# Patient Record
Sex: Male | Born: 1950
Health system: Southern US, Community
[De-identification: ages and names within clinical notes are randomized; demographics above are authoritative.]

## PROBLEM LIST (undated history)

## (undated) DIAGNOSIS — E785 Hyperlipidemia, unspecified: Secondary | ICD-10-CM

## (undated) DIAGNOSIS — I10 Essential (primary) hypertension: Secondary | ICD-10-CM

## (undated) DIAGNOSIS — T7840XA Allergy, unspecified, initial encounter: Secondary | ICD-10-CM

## (undated) DIAGNOSIS — L409 Psoriasis, unspecified: Secondary | ICD-10-CM

## (undated) DIAGNOSIS — M109 Gout, unspecified: Secondary | ICD-10-CM

## (undated) DIAGNOSIS — R748 Abnormal levels of other serum enzymes: Secondary | ICD-10-CM

## (undated) DIAGNOSIS — M5430 Sciatica, unspecified side: Secondary | ICD-10-CM

## (undated) DIAGNOSIS — F1010 Alcohol abuse, uncomplicated: Secondary | ICD-10-CM

## (undated) DIAGNOSIS — R569 Unspecified convulsions: Secondary | ICD-10-CM

## (undated) HISTORY — DX: Unspecified convulsions: R56.9

## (undated) HISTORY — DX: Psoriasis, unspecified: L40.9

## (undated) HISTORY — PX: ROTATOR CUFF REPAIR: SHX139

## (undated) HISTORY — DX: Abnormal levels of other serum enzymes: R74.8

## (undated) HISTORY — DX: Gilbert syndrome: E80.4

## (undated) HISTORY — DX: Hyperlipidemia, unspecified: E78.5

## (undated) HISTORY — DX: Essential (primary) hypertension: I10

## (undated) HISTORY — PX: VASECTOMY: SHX75

## (undated) HISTORY — PX: FOOT SURGERY: SHX648

## (undated) HISTORY — DX: Allergy, unspecified, initial encounter: T78.40XA

## (undated) HISTORY — DX: Alcohol abuse, uncomplicated: F10.10

## (undated) HISTORY — DX: Sciatica, unspecified side: M54.30

## (undated) HISTORY — DX: Gout, unspecified: M10.9

---

## 2005-12-13 HISTORY — PX: LUMBAR DISC SURGERY: SHX700

## 2005-12-13 HISTORY — PX: CHOLECYSTECTOMY: SHX55

## 2007-09-05 ENCOUNTER — Ambulatory Visit: Payer: Self-pay | Admitting: Orthopedic Surgery

## 2007-09-19 ENCOUNTER — Other Ambulatory Visit: Payer: Self-pay

## 2007-09-19 ENCOUNTER — Ambulatory Visit: Payer: Self-pay | Admitting: Orthopedic Surgery

## 2007-09-27 ENCOUNTER — Ambulatory Visit: Payer: Self-pay | Admitting: Orthopedic Surgery

## 2010-01-22 ENCOUNTER — Emergency Department: Payer: Self-pay

## 2010-07-21 ENCOUNTER — Ambulatory Visit: Payer: Self-pay | Admitting: Family Medicine

## 2010-08-10 ENCOUNTER — Observation Stay: Payer: Self-pay | Admitting: Internal Medicine

## 2010-12-03 ENCOUNTER — Ambulatory Visit: Payer: Self-pay | Admitting: Urology

## 2012-10-14 ENCOUNTER — Emergency Department: Payer: Self-pay | Admitting: Emergency Medicine

## 2012-11-13 ENCOUNTER — Ambulatory Visit: Payer: Self-pay | Admitting: Family Medicine

## 2012-11-20 ENCOUNTER — Ambulatory Visit: Payer: Self-pay | Admitting: Family Medicine

## 2014-11-26 DIAGNOSIS — F1010 Alcohol abuse, uncomplicated: Secondary | ICD-10-CM | POA: Insufficient documentation

## 2014-11-26 DIAGNOSIS — N400 Enlarged prostate without lower urinary tract symptoms: Secondary | ICD-10-CM | POA: Insufficient documentation

## 2015-01-06 ENCOUNTER — Encounter (HOSPITAL_COMMUNITY): Payer: Self-pay | Admitting: Clinical

## 2015-01-06 ENCOUNTER — Encounter (INDEPENDENT_AMBULATORY_CARE_PROVIDER_SITE_OTHER): Payer: Self-pay

## 2015-01-06 ENCOUNTER — Ambulatory Visit (INDEPENDENT_AMBULATORY_CARE_PROVIDER_SITE_OTHER): Payer: BLUE CROSS/BLUE SHIELD | Admitting: Clinical

## 2015-01-06 DIAGNOSIS — F1010 Alcohol abuse, uncomplicated: Secondary | ICD-10-CM | POA: Diagnosis not present

## 2015-01-06 HISTORY — DX: Alcohol abuse, uncomplicated: F10.10
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
## 2015-01-15 ENCOUNTER — Encounter (HOSPITAL_COMMUNITY): Payer: Self-pay | Admitting: Clinical

## 2015-01-15 ENCOUNTER — Ambulatory Visit (INDEPENDENT_AMBULATORY_CARE_PROVIDER_SITE_OTHER): Payer: BLUE CROSS/BLUE SHIELD | Admitting: Clinical

## 2015-01-15 DIAGNOSIS — F1010 Alcohol abuse, uncomplicated: Secondary | ICD-10-CM | POA: Diagnosis not present

## 2015-01-16 ENCOUNTER — Telehealth (HOSPITAL_COMMUNITY): Payer: Self-pay

## 2015-01-16 NOTE — Progress Notes (Signed)
   THERAPIST PROGRESS NOTE  Session Time: 1:30-2:30  Participation Level: Active  Behavioral Response: Casual and NeatAlertNA  Type of Therapy: Individual Therapy  Treatment Goals addressed: Relapse prevention  Interventions: Motivational Interviewing  Summary: ANITA MCADORY is a 64 y.o. male who presents with Alcohol use disorder.   Suicidal/Homicidal: Nowithout intent/plan  Therapist Response:  Pieter Partridge met with clinician for an individual session. Emiel discussed his recovery process and his current life events. Makyi shared his progress on completing task required to be allowed to return to work. Gregori shared that he had been medically cleared by his doctor to drive. Castulo shared about his conversations with his boss about returning to work. He shared about his family and their support. He also discussed his daughter who has a substance use disorder. Laverne reported that he had shared with everyone in his family, except the daughter with the substance use disorder, about his alcohol abuse. When asked why he shared that it wouldn't be helpful. Clinician discussed  AA and  [facility] as a possible place for peer support. Junaid shared that others had made the same suggestion but he had not attended a meeting yet. Login and clinician discussed the progression of his alcohol use. Ova denied any past alcohol abuse ( though he has a prior DUI), he shared that he started using it as a way to deal with stress, and that he was hiding the extent of his use from his family. Client and clinician discussed healthier coping strategies. Orel denies any alcohol use since Jan 1. He shared that he does not currently have any desire to drink.    Plan: Return again in 1 week.  Diagnosis: Axis I: Alcohol use disorder       Kenyata Guess A, LCSW 01/16/2015

## 2015-01-24 ENCOUNTER — Encounter (HOSPITAL_COMMUNITY): Payer: Self-pay | Admitting: Clinical

## 2015-01-24 ENCOUNTER — Ambulatory Visit (INDEPENDENT_AMBULATORY_CARE_PROVIDER_SITE_OTHER): Payer: BLUE CROSS/BLUE SHIELD | Admitting: Clinical

## 2015-01-24 DIAGNOSIS — F1010 Alcohol abuse, uncomplicated: Secondary | ICD-10-CM

## 2015-01-24 NOTE — Progress Notes (Signed)
   THERAPIST PROGRESS NOTE  Session Time: 10:05 - 11:05  Participation Level: Active  Behavioral Response: NeatAlert and ConfusedAnxious  Type of Therapy: Individual Therapy  Treatment Goals addressed: improve psychiatric symptoms, relapse prevention  Interventions: Motivational Interviewing  Summary: Jeffrey Yates is a 64 y.o. male who presents with Alcohol Use Disorder .   Suicidal/Homicidal: Nowithout intent/plan  Therapist Response:  Pieter Partridge met with clinician for an individual session. Jlynn discussed his psychiatric symptoms and his desire to return to work. Client and clinician discussed an assessment that his company would like done. Clinician was able to offer the assessment. Client asked if clinician would discuss his progress with his supervisor. Bralin put his supervisor on the phone and clinician briefly updated him on [name]'s progress. Pieter Partridge and clinician discussed his thoughts and insights about the interaction. Pieter Partridge and clinician discussed relapse prevention. Pieter Partridge and clinician discussed Geneva's plans to tell his oldest daughter about his al;lcohol abuse and his troubles. Pieter Partridge and clinician discussed [place] and [facility] [place]. Longino agreed to make a meeting and to discuss it at next session.     Plan: Return again in 1 weeks.  Diagnosis: Axis I: Summary: Jeffrey Yates is a 64 y.o. male who presents with Alcohol Use Disorder       Garyn Arlotta A, LCSW 01/24/2015

## 2015-02-03 ENCOUNTER — Encounter (HOSPITAL_COMMUNITY): Payer: Self-pay | Admitting: Clinical

## 2015-02-03 ENCOUNTER — Ambulatory Visit (INDEPENDENT_AMBULATORY_CARE_PROVIDER_SITE_OTHER): Payer: BLUE CROSS/BLUE SHIELD | Admitting: Clinical

## 2015-02-03 DIAGNOSIS — F1010 Alcohol abuse, uncomplicated: Secondary | ICD-10-CM | POA: Diagnosis not present

## 2015-02-05 NOTE — Progress Notes (Signed)
   THERAPIST PROGRESS NOTE  Session Time:  2:37 - 3:35  Participation Level: Active  Behavioral Response: Neat and Well GroomedAlertNA  Type of Therapy: Individual Therapy  Treatment Goals addressed: Improve psychiatric symptoms, relapse prevention skills  Interventions: CBT and Motivational Interviewing , psycho education  Summary: Jeffrey Yates is a 64 y.o. male who presents with Alcohol Use Disorder .   Suicidal/Homicidal: Nowithout intent/plan  Therapist Response:  Pieter Partridge met with clinician for an individual session. Zeplin dicussed his psychiatric symptoms and his current life events. Tallon shared that he had attended two [place] meetings since our last meeting. He discussed his experience in the meeting. He shared some of the similarities and differences between him and some of the other attendees. Client and clinician discussed how alcohol dependence is present in every race, financial and social class. Hisashi shared that he did connect with two of the members and was planning to continue attending. He shared that his family continues to be supportive of his efforts. Peggy shared that he had a lot of thoughts and emotions attached to the thought that he was "weak" because he let his drinking get out of control. Client and clinician discussed the fact that there is a genetic component to addiction. Client and clinician discussed the fact that he may not be able to help the fact that he is addicted to alcohol but he does have the ability to choose not to drink. Diaz shared his thoughts about the topic. Markez stated that he was able to get his job back and would be able to start on 02/10/15. He reported that he is very excited about it.Client and clinician discussed his past triggers for alcohol use. He shared that stress was one of them. Nowell shared about some of his stresses including working too much. Client and clinician began a conversation about healthy boundaries and  Setting aside  time for self  care. Client and clinician discussed some stress relieving activities.Melbert agreed to some homework which included listing his triggers, and listing some actions he could take to be more balanced in his life.  Plan: Return again in 2 weeks.  Diagnosis: Axis I: Alcohol Use Disorder       Kadarrius Yanke A, LCSW 02/05/2015

## 2015-02-17 ENCOUNTER — Ambulatory Visit (INDEPENDENT_AMBULATORY_CARE_PROVIDER_SITE_OTHER): Payer: BLUE CROSS/BLUE SHIELD | Admitting: Clinical

## 2015-02-17 ENCOUNTER — Encounter (HOSPITAL_COMMUNITY): Payer: Self-pay | Admitting: Clinical

## 2015-02-17 DIAGNOSIS — F1010 Alcohol abuse, uncomplicated: Secondary | ICD-10-CM

## 2015-02-17 NOTE — Progress Notes (Signed)
   THERAPIST PROGRESS NOTE  Session Time: 9:03 - 10:00  Participation Level: Active  Behavioral Response: Casual and NeatAlertNA  Type of Therapy: Individual Therapy  Treatment Goals addressed: improve psychiatric symptoms, and relapse prevention skills  Interventions: Motivational Interviewing  Summary: Jeffrey Yates is a 64 y.o. male who presents with Alcohol use disorder.   Suicidal/Homicidal: Nowithout intent/plan  Therapist Response:  Pieter Partridge met with clinician for an individual session. Carthel discussed his psychiatric symptoms, his current life events, and his homework. Abdon shared that he had been able to return to work. He shared that he continues to remain sober. He stated that he had attended some AA meetings but did not feel like they were right for him at this time. Client and clinician discussed the importance of having a support system. Abanoub shared about the people he would be willing to let in on his issue with alcohol and those he wouldn't. Chantz was able to identify a friend he would be willing to call if he was struggling. Calvyn shared his homework which was to create a list of triggers. Zubayr shared that he was not currently experiencing any triggers. Clinician asked Demorris some open ended questions and Ottavio was then able to identify triggers in the past. Pieter Partridge and clinician discussed some up coming events that Fausto will be attending that in the past he would have drank alcohol at. Client and clinician discussed how preplanning can help prevent a relapse. Kwasi discussed some healthy options for some of his events.   Plan: Return again in 2 weeks.  Diagnosis: Axis I: Alcohol use disorder      Roselinda Bahena A, LCSW 02/17/2015

## 2015-03-03 ENCOUNTER — Ambulatory Visit (INDEPENDENT_AMBULATORY_CARE_PROVIDER_SITE_OTHER): Payer: BLUE CROSS/BLUE SHIELD | Admitting: Clinical

## 2015-03-03 DIAGNOSIS — F1010 Alcohol abuse, uncomplicated: Secondary | ICD-10-CM | POA: Diagnosis not present

## 2015-03-04 ENCOUNTER — Encounter (HOSPITAL_COMMUNITY): Payer: Self-pay | Admitting: Clinical

## 2015-03-04 NOTE — Progress Notes (Signed)
   THERAPIST PROGRESS NOTE  Session Time: 9:10 -10:05  Participation Level: Active  Behavioral Response: Casual and NeatAlertNA  Type of Therapy: Individual Therapy  Treatment Goals addressed: improve psychiatric symptoms, emotional regulation, improve faulty thinking patterns, and relapse prevention skills  Interventions: Motivational Interviewing and cbt  Summary: Jeffrey Yates is a 64 y.o. male who presents with Alcohol use disorder.   Suicidal/Homicidal: No -without intent/plan  Therapist Response:  Pieter Partridge met with clinician for an individual session. Avery discussed his psychiatric symptoms, his current life events, and his homework. Mitch shared that he had been a bit stressed after retuning to work. He shared that while he loves his job it has been a slow quarter which makes him feel compelled to produce. 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 Client and clinician discussed stress as a trigger for relapse. Client and clinician discussed his old coping skill for dealing with stress ( alcohol use) versus healthier alternative strategies. . Client and clinician discussed his thoughts about relapse and triggers. Clinician introduced the idea that a relapse begins before the drink, that a relapse is often proceeded by stressors or events that are not dealt with in a healthy way. Donnovan shared some of the changes he has been working on to improve his relationships with his wife and kids. Larsen shared about some of the irritations he has experienced. He shared how he currently is dealing with them. Elise brainstormed additional ways to deal with the irritations and what the possible outcomes might be. Client and clinician discussed how his thoughts influence his experiences. Client and clinician agreed to discuss this further at a future session.   Plan: Return again in 2 weeks.  Diagnosis:     Axis I: Alcohol use disorder   Trejon Duford A,  LCSW 03/04/2015

## 2015-03-19 ENCOUNTER — Ambulatory Visit (HOSPITAL_COMMUNITY): Payer: BLUE CROSS/BLUE SHIELD | Admitting: Clinical

## 2015-07-14 ENCOUNTER — Ambulatory Visit (INDEPENDENT_AMBULATORY_CARE_PROVIDER_SITE_OTHER): Payer: BLUE CROSS/BLUE SHIELD | Admitting: Family Medicine

## 2015-07-14 ENCOUNTER — Encounter: Payer: Self-pay | Admitting: Family Medicine

## 2015-07-14 VITALS — BP 112/78 | HR 101 | Temp 98.6°F | Resp 19 | Ht 72.0 in | Wt 201.2 lb

## 2015-07-14 DIAGNOSIS — R569 Unspecified convulsions: Secondary | ICD-10-CM | POA: Insufficient documentation

## 2015-07-14 DIAGNOSIS — E785 Hyperlipidemia, unspecified: Secondary | ICD-10-CM | POA: Insufficient documentation

## 2015-07-14 DIAGNOSIS — Z8639 Personal history of other endocrine, nutritional and metabolic disease: Secondary | ICD-10-CM | POA: Diagnosis not present

## 2015-07-14 DIAGNOSIS — I10 Essential (primary) hypertension: Secondary | ICD-10-CM | POA: Insufficient documentation

## 2015-07-14 DIAGNOSIS — J309 Allergic rhinitis, unspecified: Secondary | ICD-10-CM | POA: Insufficient documentation

## 2015-07-14 DIAGNOSIS — E7800 Pure hypercholesterolemia, unspecified: Secondary | ICD-10-CM | POA: Insufficient documentation

## 2015-07-14 DIAGNOSIS — Z8739 Personal history of other diseases of the musculoskeletal system and connective tissue: Secondary | ICD-10-CM | POA: Insufficient documentation

## 2015-07-14 DIAGNOSIS — F1020 Alcohol dependence, uncomplicated: Secondary | ICD-10-CM | POA: Insufficient documentation

## 2015-07-14 DIAGNOSIS — F1010 Alcohol abuse, uncomplicated: Secondary | ICD-10-CM | POA: Insufficient documentation

## 2015-07-14 HISTORY — DX: Unspecified convulsions: R56.9

## 2015-07-14 MED ORDER — METOPROLOL SUCCINATE ER 100 MG PO TB24: 100.0000 mg | ORAL_TABLET | Freq: Every day | ORAL | Status: DC

## 2015-07-14 MED ORDER — ALLOPURINOL 100 MG PO TABS: 100.0000 mg | ORAL_TABLET | Freq: Every day | ORAL | Status: DC

## 2015-07-14 NOTE — Progress Notes (Signed)
Name: Jeffrey Yates   MRN: [id number]    DOB: [birth year]   Date:07/14/2015       Progress Note  Subjective  Chief Complaint  Chief Complaint  Patient presents with  . Establish Care    NP (Dr. Miles Costain)   . Medication Refill  . Hypertension  . Hyperlipidemia    Hypertension This is a chronic problem. The problem is controlled. Pertinent negatives include no blurred vision, chest pain, headaches, orthopnea, palpitations or shortness of breath. Past treatments include beta blockers. There is no history of kidney disease, CAD/MI or CVA.  Hyperlipidemia This is a chronic problem. Recent lipid tests were reviewed and are high. Pertinent negatives include no chest pain, leg pain, myalgias or shortness of breath. Current antihyperlipidemic treatment includes diet change.  Gout History of gout, currently on Allopurinol 100 mg daily. Last gout attack was over 2 years ago. Has made dietary changes to avoid foods that may elevate Uric acid levels.    Past Medical History  Diagnosis Date  . Alcohol abuse 01/06/15    Siezure from Withdrawl - 11/26/14  . Hyperlipidemia   . Hypertension   . Allergy     Past Surgical History  Procedure Laterality Date  . Cholecystectomy  2007  . Lumbar disc surgery  2007  . Vasectomy      Family History  Problem Relation Age of Onset  . Alcohol abuse Paternal Grandfather     History   Social History  . Marital Status: Married    Spouse Name: N/A  . Number of Children: N/A  . Years of Education: N/A   Occupational History  . Not on file.   Social History Main Topics  . Smoking status: Light Tobacco Smoker  . Smokeless tobacco: Not on file  . Alcohol Use: No  . Drug Use: No  . Sexual Activity: No   Other Topics Concern  . Not on file   Social History Narrative     Current outpatient prescriptions:  .  allopurinol (ZYLOPRIM) 100 MG tablet, Take 100 mg by mouth., Disp: , Rfl:  .  B Complex Vitamins (VITAMIN B COMPLEX IJ), Take 1 tablet  by mouth., Disp: , Rfl:  .  Cholecalciferol (VITAMIN D3) 1000 UNITS CAPS, Take 1 capsule by mouth., Disp: , Rfl:  .  dutasteride (AVODART) 0.5 MG capsule, Take by mouth., Disp: , Rfl:  .  dutasteride (AVODART) 0.5 MG capsule, , Disp: , Rfl: 0 .  metoprolol succinate (TOPROL-XL) 100 MG 24 hr tablet, , Disp: , Rfl: 0 .  Omega-3 Fatty Acids (FISH OIL PO), , Disp: , Rfl:  .  zolpidem (AMBIEN) 10 MG tablet, Take by mouth., Disp: , Rfl:   No Known Allergies   Review of Systems  Constitutional: Negative for fever and chills.  Eyes: Negative for blurred vision.  Respiratory: Negative for shortness of breath.   Cardiovascular: Negative for chest pain, palpitations and orthopnea.  Musculoskeletal: Positive for back pain. Negative for myalgias and joint pain.  Neurological: Negative for headaches.     Objective  Filed Vitals:   07/14/15 1531  BP: 112/78  Pulse: 101  Temp: 98.6 F (37 C)  TempSrc: Oral  Resp: 19  Height: 6' (1.829 m)  Weight: 201 lb 3.2 oz (91.264 kg)  SpO2: 97%    Physical Exam  Constitutional: He is oriented to person, place, and time and well-developed, well-nourished, and in no distress.  HENT:  Head: Normocephalic and atraumatic.  Cardiovascular: Normal rate and regular  rhythm.   Pulmonary/Chest: Effort normal and breath sounds normal.  Musculoskeletal: He exhibits no edema.  Neurological: He is alert and oriented to person, place, and time.  Skin: Skin is warm and dry.  Psychiatric: Affect normal.  Nursing note and vitals reviewed.    Assessment & Plan 1. Essential hypertension Blood pressure stable and controlled on present therapy. Refills for metoprolol provided. Follow-up in 3 month - metoprolol succinate (TOPROL-XL) 100 MG 24 hr tablet; Take 1 tablet (100 mg total) by mouth daily.  Dispense: 90 tablet; Refill: 0  2. Hyperlipidemia Recheck fasting lipid panel and follow-up. - Comprehensive metabolic panel - Lipid Profile  3. History of  gout  - Uric acid - allopurinol (ZYLOPRIM) 100 MG tablet; Take 1 tablet (100 mg total) by mouth daily.  Dispense: 90 tablet; Refill: 0   [facility] 07/14/2015 3:50 PM

## 2015-08-27 LAB — COMPREHENSIVE METABOLIC PANEL
A/G RATIO: 2 (ref 1.1–2.5)
ALK PHOS: 59 IU/L (ref 39–117)
ALT: 16 IU/L (ref 0–44)
AST: 28 IU/L (ref 0–40)
Albumin: 4.5 g/dL (ref 3.6–4.8)
BUN/Creatinine Ratio: 23 — ABNORMAL HIGH (ref 10–22)
BUN: 21 mg/dL (ref 8–27)
Bilirubin Total: 1.8 mg/dL — ABNORMAL HIGH (ref 0.0–1.2)
CHLORIDE: 99 mmol/L (ref 97–108)
CO2: 29 mmol/L (ref 18–29)
Calcium: 9.5 mg/dL (ref 8.6–10.2)
Creatinine, Ser: 0.92 mg/dL (ref 0.76–1.27)
GFR calc non Af Amer: 88 mL/min/{1.73_m2} (ref >59)
GFR, EST AFRICAN AMERICAN: 101 mL/min/{1.73_m2} (ref >59)
GLUCOSE: 97 mg/dL (ref 65–99)
Globulin, Total: 2.2 g/dL (ref 1.5–4.5)
POTASSIUM: 4.7 mmol/L (ref 3.5–5.2)
Sodium: 140 mmol/L (ref 134–144)
Total Protein: 6.7 g/dL (ref 6.0–8.5)

## 2015-08-27 LAB — URIC ACID: Uric Acid: 5.4 mg/dL (ref 3.7–8.6)

## 2015-08-27 LAB — LIPID PANEL
CHOL/HDL RATIO: 4.1 ratio (ref 0.0–5.0)
Cholesterol, Total: 224 mg/dL — ABNORMAL HIGH (ref 100–199)
HDL: 54 mg/dL (ref >39)
LDL Calculated: 127 mg/dL — ABNORMAL HIGH (ref 0–99)
TRIGLYCERIDES: 213 mg/dL — AB (ref 0–149)
VLDL Cholesterol Cal: 43 mg/dL — ABNORMAL HIGH (ref 5–40)

## 2015-09-22 ENCOUNTER — Ambulatory Visit: Payer: Self-pay | Admitting: Family Medicine

## 2015-09-22 ENCOUNTER — Ambulatory Visit (INDEPENDENT_AMBULATORY_CARE_PROVIDER_SITE_OTHER): Payer: Self-pay | Admitting: Family Medicine

## 2015-09-22 ENCOUNTER — Encounter: Payer: Self-pay | Admitting: Family Medicine

## 2015-09-22 VITALS — BP 112/77 | HR 73 | Temp 98.1°F | Resp 18 | Ht 72.0 in | Wt 197.2 lb

## 2015-09-22 DIAGNOSIS — M109 Gout, unspecified: Secondary | ICD-10-CM

## 2015-09-22 DIAGNOSIS — L409 Psoriasis, unspecified: Secondary | ICD-10-CM | POA: Insufficient documentation

## 2015-09-22 DIAGNOSIS — E7800 Pure hypercholesterolemia, unspecified: Secondary | ICD-10-CM

## 2015-09-22 HISTORY — DX: Psoriasis, unspecified: L40.9

## 2015-09-22 HISTORY — DX: Gout, unspecified: M10.9

## 2015-09-22 MED ORDER — ATORVASTATIN CALCIUM 20 MG PO TABS
20.0000 mg | ORAL_TABLET | Freq: Every day | ORAL | Status: DC
Start: 2015-09-22 — End: 2016-01-07

## 2015-09-22 NOTE — Progress Notes (Signed)
Name: Jeffrey Yates   MRN: [id number]    DOB: [birth year]   Date:09/22/2015       Progress Note  Subjective  Chief Complaint  Chief Complaint  Patient presents with  . Follow-up    Statin Mangement  . Hyperlipidemia  . Hypertension  . Insomnia   Hyperlipidemia This is a chronic problem. The problem is uncontrolled. Recent lipid tests were reviewed and are high. He has no history of diabetes, hypothyroidism or liver disease. He is currently on no antihyperlipidemic treatment.   Hyperbilirubinemia Pt. Is here for evaluation of elevated bilirubin levels. Last level obtained was elevated at 1.8. The one prioir to that was normal at 1.2. Pt. Reports no history of liver disease but states he had his gallbladder removed presumably due to elevated bilirubin levels. Since then, he has had fluctuating bilirubin levels.   Past Medical History  Diagnosis Date  . Alcohol abuse 01/06/15    Siezure from Withdrawl - 11/26/14  . Hyperlipidemia   . Hypertension   . Allergy     Past Surgical History  Procedure Laterality Date  . Cholecystectomy  2007  . Lumbar disc surgery  2007  . Vasectomy      Family History  Problem Relation Age of Onset  . Alcohol abuse Paternal Grandfather     Social History   Social History  . Marital Status: Married    Spouse Name: N/A  . Number of Children: N/A  . Years of Education: N/A   Occupational History  . Not on file.   Social History Main Topics  . Smoking status: Light Tobacco Smoker  . Smokeless tobacco: Not on file  . Alcohol Use: No  . Drug Use: No  . Sexual Activity: No   Other Topics Concern  . Not on file   Social History Narrative    Current outpatient prescriptions:  .  allopurinol (ZYLOPRIM) 100 MG tablet, Take 1 tablet (100 mg total) by mouth daily., Disp: 90 tablet, Rfl: 0 .  B Complex Vitamins (VITAMIN B COMPLEX IJ), Take 1 tablet by mouth., Disp: , Rfl:  .  Cholecalciferol (VITAMIN D3) 1000 UNITS CAPS, Take 1 capsule by  mouth., Disp: , Rfl:  .  dutasteride (AVODART) 0.5 MG capsule, Take by mouth., Disp: , Rfl:  .  dutasteride (AVODART) 0.5 MG capsule, , Disp: , Rfl: 0 .  metoprolol succinate (TOPROL-XL) 100 MG 24 hr tablet, Take 1 tablet (100 mg total) by mouth daily., Disp: 90 tablet, Rfl: 0 .  Omega-3 Fatty Acids (FISH OIL PO), , Disp: , Rfl:  .  zolpidem (AMBIEN) 10 MG tablet, Take by mouth., Disp: , Rfl:   No Known Allergies  Review of Systems  Constitutional: Negative for fever, chills and weight loss.  Gastrointestinal: Negative for nausea, vomiting, abdominal pain and blood in stool.  Genitourinary: Negative for hematuria.   Objective  Filed Vitals:   09/22/15 1530  BP: 112/77  Pulse: 73  Temp: 98.1 F (36.7 C)  TempSrc: Oral  Resp: 18  Height: 6' (1.829 m)  Weight: 197 lb 3.2 oz (89.449 kg)  SpO2: 98%   Physical Exam  Constitutional: He is well-developed, well-nourished, and in no distress.  HENT:  Head: Normocephalic and atraumatic.  Cardiovascular: Normal rate and regular rhythm.   Pulmonary/Chest: Effort normal and breath sounds normal.  Abdominal: Soft. Bowel sounds are normal.  Nursing note and vitals reviewed.  Assessment & Plan  1. Hypercholesteremia View fasting lipid panel with patient. He will start  on Lipitor 20 mg at bedtime after reviewing his liver enzymes and bilirubin levels. - atorvastatin (LIPITOR) 20 MG tablet; Take 1 tablet (20 mg total) by mouth daily.  Dispense: 90 tablet; Refill: 0  2. Hyperbilirubinemia Patient has history of hyperbilirubinemia. Will obtain levels and if they are persistently elevated, he will return for complete workup. - Comprehensive Metabolic Panel (CMET)   Laurie Lovejoy Asad A. [facility] 09/22/2015 3:49 PM

## 2015-09-23 LAB — COMPREHENSIVE METABOLIC PANEL
ALK PHOS: 67 IU/L (ref 39–117)
ALT: 30 IU/L (ref 0–44)
AST: 31 IU/L (ref 0–40)
Albumin/Globulin Ratio: 1.8 (ref 1.1–2.5)
Albumin: 4.4 g/dL (ref 3.6–4.8)
BUN/Creatinine Ratio: 25 — ABNORMAL HIGH (ref 10–22)
BUN: 20 mg/dL (ref 8–27)
Bilirubin Total: 0.7 mg/dL (ref 0.0–1.2)
CO2: 16 mmol/L — AB (ref 18–29)
CREATININE: 0.79 mg/dL (ref 0.76–1.27)
Calcium: 9.3 mg/dL (ref 8.6–10.2)
Chloride: 103 mmol/L (ref 97–108)
GFR calc Af Amer: 110 mL/min/{1.73_m2} (ref >59)
GFR calc non Af Amer: 95 mL/min/{1.73_m2} (ref >59)
Globulin, Total: 2.4 g/dL (ref 1.5–4.5)
Glucose: 84 mg/dL (ref 65–99)
Potassium: 4.4 mmol/L (ref 3.5–5.2)
Sodium: 142 mmol/L (ref 134–144)
Total Protein: 6.8 g/dL (ref 6.0–8.5)

## 2015-10-15 ENCOUNTER — Ambulatory Visit: Payer: BLUE CROSS/BLUE SHIELD | Admitting: Family Medicine

## 2015-10-20 ENCOUNTER — Encounter: Payer: Self-pay | Admitting: Family Medicine

## 2015-10-20 ENCOUNTER — Ambulatory Visit (INDEPENDENT_AMBULATORY_CARE_PROVIDER_SITE_OTHER): Payer: 59 | Admitting: Family Medicine

## 2015-10-20 VITALS — BP 112/80 | HR 81 | Temp 98.4°F | Resp 18 | Ht 72.0 in | Wt 198.0 lb

## 2015-10-20 DIAGNOSIS — G4700 Insomnia, unspecified: Secondary | ICD-10-CM | POA: Diagnosis not present

## 2015-10-20 DIAGNOSIS — Z23 Encounter for immunization: Secondary | ICD-10-CM | POA: Diagnosis not present

## 2015-10-20 DIAGNOSIS — I10 Essential (primary) hypertension: Secondary | ICD-10-CM

## 2015-10-20 DIAGNOSIS — E7800 Pure hypercholesterolemia, unspecified: Secondary | ICD-10-CM | POA: Diagnosis not present

## 2015-10-20 MED ORDER — ZOLPIDEM TARTRATE 10 MG PO TABS: 10.0000 mg | ORAL_TABLET | Freq: Every day | ORAL | Status: DC

## 2015-10-20 NOTE — Progress Notes (Signed)
Name: Jeffrey Yates   MRN: [id number]    DOB: 04-01-1951   Date:10/20/2015       Progress Note  Subjective  Chief Complaint  Chief Complaint  Patient presents with  . Follow-up    3 mo  . Gout  . Hyperlipidemia  . Hypertension    Hyperlipidemia This is a chronic problem. The problem is uncontrolled. Pertinent negatives include no chest pain, myalgias or shortness of breath. Current antihyperlipidemic treatment includes statins.  Hypertension This is a chronic problem. The problem is controlled. Pertinent negatives include no blurred vision, chest pain, headaches, palpitations or shortness of breath. Past treatments include beta blockers. There is no history of kidney disease, CAD/MI or CVA.     Past Medical History  Diagnosis Date  . Alcohol abuse 01/06/15    Siezure from Withdrawl - 11/26/14  . Hyperlipidemia   . Hypertension   . Allergy     Past Surgical History  Procedure Laterality Date  . Cholecystectomy  2007  . Lumbar disc surgery  2007  . Vasectomy      Family History  Problem Relation Age of Onset  . Alcohol abuse Paternal Grandfather     Social History   Social History  . Marital Status: Married    Spouse Name: N/A  . Number of Children: N/A  . Years of Education: N/A   Occupational History  . Not on file.   Social History Main Topics  . Smoking status: Light Tobacco Smoker  . Smokeless tobacco: Not on file  . Alcohol Use: No  . Drug Use: No  . Sexual Activity: No   Other Topics Concern  . Not on file   Social History Narrative     Current outpatient prescriptions:  .  allopurinol (ZYLOPRIM) 100 MG tablet, Take 1 tablet (100 mg total) by mouth daily., Disp: 90 tablet, Rfl: 0 .  atorvastatin (LIPITOR) 20 MG tablet, Take 1 tablet (20 mg total) by mouth daily., Disp: 90 tablet, Rfl: 0 .  B Complex Vitamins (VITAMIN B COMPLEX IJ), Take 1 tablet by mouth., Disp: , Rfl:  .  Cholecalciferol (VITAMIN D3) 1000 UNITS CAPS, Take 1 capsule by  mouth., Disp: , Rfl:  .  dutasteride (AVODART) 0.5 MG capsule, Take by mouth., Disp: , Rfl:  .  dutasteride (AVODART) 0.5 MG capsule, , Disp: , Rfl: 0 .  metoprolol succinate (TOPROL-XL) 100 MG 24 hr tablet, Take 1 tablet (100 mg total) by mouth daily., Disp: 90 tablet, Rfl: 0 .  Omega-3 Fatty Acids (FISH OIL PO), , Disp: , Rfl:  .  zolpidem (AMBIEN) 10 MG tablet, Take by mouth., Disp: , Rfl:   No Known Allergies   Review of Systems  Eyes: Negative for blurred vision and double vision.  Respiratory: Negative for shortness of breath.   Cardiovascular: Negative for chest pain and palpitations.  Musculoskeletal: Negative for myalgias.  Neurological: Negative for headaches.  Psychiatric/Behavioral: The patient is not nervous/anxious.     Objective  Filed Vitals:   10/20/15 1213  BP: 112/80  Pulse: 81  Temp: 98.4 F (36.9 C)  TempSrc: Oral  Resp: 18  Height: 6' (1.829 m)  Weight: 198 lb (89.812 kg)  SpO2: 96%    Physical Exam  Constitutional: He is oriented to person, place, and time and well-developed, well-nourished, and in no distress.  HENT:  Head: Normocephalic and atraumatic.  Cardiovascular: Normal rate, regular rhythm and normal heart sounds.   No murmur heard. Pulmonary/Chest: Effort normal and breath  sounds normal. No respiratory distress. He has no wheezes.  Neurological: He is alert and oriented to person, place, and time. No cranial nerve deficit.  Psychiatric: Mood, memory, affect and judgment normal.  Nursing note and vitals reviewed.   Assessment & Plan  1. Need for immunization against influenza  - Flu Vaccine QUAD 36+ mos PF IM (Fluarix & Fluzone Quad PF)  2. Hypercholesteremia Patient on atorvastatin 20 mg at bedtime. We will recheck lipid panel today. - Lipid Profile  3. Cannot sleep Patient wishes to be restarted on Ambien 10 mg at bedtime, to be taken as needed. He was on Ambien by previous PCP, Dr. Jacqualine Code. Recommended to avoid alcohol while  taking Ambien. Prescription provided and follow-up in 3 months.  - zolpidem (AMBIEN) 10 MG tablet; Take 1 tablet (10 mg total) by mouth at bedtime.  Dispense: 30 tablet; Refill: 0   4. Essential hypertension BP stable and controlled on present therapy.   Kimmy Parish Asad A. [facility] 10/20/2015 12:35 PM

## 2015-11-18 ENCOUNTER — Other Ambulatory Visit: Payer: Self-pay | Admitting: Family Medicine

## 2015-12-02 ENCOUNTER — Ambulatory Visit: Payer: 59 | Admitting: Family Medicine

## 2016-01-01 LAB — LIPID PANEL
Chol/HDL Ratio: 4.1 ratio units (ref 0.0–5.0)
Cholesterol, Total: 235 mg/dL — ABNORMAL HIGH (ref 100–199)
HDL: 58 mg/dL (ref >39)
LDL Calculated: 136 mg/dL — ABNORMAL HIGH (ref 0–99)
TRIGLYCERIDES: 205 mg/dL — AB (ref 0–149)
VLDL Cholesterol Cal: 41 mg/dL — ABNORMAL HIGH (ref 5–40)

## 2016-01-07 ENCOUNTER — Telehealth: Payer: Self-pay

## 2016-01-07 DIAGNOSIS — E7800 Pure hypercholesterolemia, unspecified: Secondary | ICD-10-CM

## 2016-01-07 MED ORDER — ATORVASTATIN CALCIUM 20 MG PO TABS: 20.0000 mg | ORAL_TABLET | Freq: Every day | ORAL | Status: DC

## 2016-01-07 NOTE — Telephone Encounter (Signed)
Medication has been refilled and sent to [facility]

## 2016-01-19 ENCOUNTER — Ambulatory Visit: Payer: 59 | Admitting: Family Medicine

## 2016-02-27 ENCOUNTER — Encounter: Payer: Self-pay | Admitting: Family Medicine

## 2016-02-27 ENCOUNTER — Ambulatory Visit (INDEPENDENT_AMBULATORY_CARE_PROVIDER_SITE_OTHER): Payer: 59 | Admitting: Family Medicine

## 2016-02-27 VITALS — BP 122/80 | HR 77 | Temp 98.9°F | Resp 18 | Ht 72.0 in | Wt 190.4 lb

## 2016-02-27 DIAGNOSIS — G4700 Insomnia, unspecified: Secondary | ICD-10-CM

## 2016-02-27 MED ORDER — ZOLPIDEM TARTRATE 10 MG PO TABS: 5.0000 mg | ORAL_TABLET | Freq: Every evening | ORAL | Status: DC | PRN

## 2016-02-27 NOTE — Progress Notes (Signed)
Name: Jeffrey Yates   MRN: [id number]    DOB: March 01, 1951   Date:02/27/2016       Progress Note  Subjective  Chief Complaint  Chief Complaint  Patient presents with  . Hypertension    follow up    HPI  Insomnia: No trouble falling asleep (goes to bed around 9PM), but occasionally wakes up around 1 AM and may not fall back asleep until 5-6AM. He takes Ambien 1/2 tablet when he wakes up and cannot go back to sleep. No side effects reported.    Past Medical History  Diagnosis Date  . Alcohol abuse 01/06/15    Siezure from Withdrawl - 11/26/14  . Hyperlipidemia   . Hypertension   . Allergy     Past Surgical History  Procedure Laterality Date  . Cholecystectomy  2007  . Lumbar disc surgery  2007  . Vasectomy      Family History  Problem Relation Age of Onset  . Alcohol abuse Paternal Grandfather     Social History   Social History  . Marital Status: Married    Spouse Name: N/A  . Number of Children: N/A  . Years of Education: N/A   Occupational History  . Not on file.   Social History Main Topics  . Smoking status: Light Tobacco Smoker  . Smokeless tobacco: Not on file  . Alcohol Use: No  . Drug Use: No  . Sexual Activity: No   Other Topics Concern  . Not on file   Social History Narrative     Current outpatient prescriptions:  .  allopurinol (ZYLOPRIM) 100 MG tablet, Take 1 tablet (100 mg total) by mouth daily., Disp: 90 tablet, Rfl: 0 .  atorvastatin (LIPITOR) 20 MG tablet, Take 1 tablet (20 mg total) by mouth daily., Disp: 90 tablet, Rfl: 0 .  B Complex Vitamins (VITAMIN B COMPLEX IJ), Take 1 tablet by mouth., Disp: , Rfl:  .  Cholecalciferol (VITAMIN D3) 1000 UNITS CAPS, Take 1 capsule by mouth., Disp: , Rfl:  .  dutasteride (AVODART) 0.5 MG capsule, Take by mouth., Disp: , Rfl:  .  dutasteride (AVODART) 0.5 MG capsule, , Disp: , Rfl: 0 .  metoprolol succinate (TOPROL-XL) 100 MG 24 hr tablet, take 1 tablet by mouth once daily, Disp: 90 tablet, Rfl:  0 .  Omega-3 Fatty Acids (FISH OIL PO), , Disp: , Rfl:  .  zolpidem (AMBIEN) 10 MG tablet, Take 1 tablet (10 mg total) by mouth at bedtime., Disp: 30 tablet, Rfl: 0  No Known Allergies   Review of Systems  Constitutional: Negative for fever and chills.  Psychiatric/Behavioral: Negative for depression. The patient has insomnia. The patient is not nervous/anxious.       Objective  Filed Vitals:   02/27/16 0948  BP: 122/80  Pulse: 77  Temp: 98.9 F (37.2 C)  TempSrc: Oral  Resp: 18  Height: 6' (1.829 m)  Weight: 190 lb 6.4 oz (86.365 kg)  SpO2: 95%    Physical Exam  Constitutional: He is oriented to person, place, and time and well-developed, well-nourished, and in no distress.  Cardiovascular: Normal rate, regular rhythm and normal heart sounds.   Pulmonary/Chest: Effort normal and breath sounds normal.  Neurological: He is alert and oriented to person, place, and time.  Nursing note and vitals reviewed.      Assessment & Plan  1. Cannot sleep Refill for Ambien provided. May take as needed. - zolpidem (AMBIEN) 10 MG tablet; Take 0.5 tablets (5 mg  total) by mouth at bedtime as needed for sleep.  Dispense: 30 tablet; Refill: 0   Cleland Simkins Asad A. [facility] 02/27/2016 10:10 AM

## 2016-03-12 ENCOUNTER — Other Ambulatory Visit: Payer: Self-pay | Admitting: Family Medicine

## 2016-03-12 NOTE — Telephone Encounter (Signed)
Medication has been refilled and sent to [facility]

## 2016-04-07 ENCOUNTER — Ambulatory Visit (INDEPENDENT_AMBULATORY_CARE_PROVIDER_SITE_OTHER): Payer: 59 | Admitting: Family Medicine

## 2016-04-07 ENCOUNTER — Encounter: Payer: Self-pay | Admitting: Family Medicine

## 2016-04-07 VITALS — BP 118/81 | HR 86 | Temp 98.4°F | Resp 18 | Ht 72.0 in | Wt 185.5 lb

## 2016-04-07 DIAGNOSIS — E785 Hyperlipidemia, unspecified: Secondary | ICD-10-CM

## 2016-04-07 MED ORDER — ATORVASTATIN CALCIUM 20 MG PO TABS: 20.0000 mg | ORAL_TABLET | Freq: Every day | ORAL | Status: DC

## 2016-04-07 NOTE — Progress Notes (Signed)
Name: Jeffrey Yates   MRN: [id number]    DOB: 02-08-1951   Date:04/07/2016       Progress Note  Subjective  Chief Complaint  Chief Complaint  Patient presents with  . Follow-up    1 mo  . Medication Refill    lipitor 20 mg     Hyperlipidemia This is a chronic problem. The problem is uncontrolled. Recent lipid tests were reviewed and are high. Pertinent negatives include no chest pain, myalgias or shortness of breath. Current antihyperlipidemic treatment includes statins. There are no compliance problems (At the time of last FLP in January 2017, he was not taking Atorvastatin regularly, did not take it in the preceding 3 months.).  Risk factors for coronary artery disease include dyslipidemia and male sex.  Patient has now resumed taking atorvastatin, we will recheck FLP today.  Past Medical History  Diagnosis Date  . Alcohol abuse 01/06/15    Siezure from Withdrawl - 11/26/14  . Hyperlipidemia   . Hypertension   . Allergy     Past Surgical History  Procedure Laterality Date  . Cholecystectomy  2007  . Lumbar disc surgery  2007  . Vasectomy      Family History  Problem Relation Age of Onset  . Alcohol abuse Paternal Grandfather     Social History   Social History  . Marital Status: Married    Spouse Name: N/A  . Number of Children: N/A  . Years of Education: N/A   Occupational History  . Not on file.   Social History Main Topics  . Smoking status: Light Tobacco Smoker  . Smokeless tobacco: Not on file  . Alcohol Use: No  . Drug Use: No  . Sexual Activity: No   Other Topics Concern  . Not on file   Social History Narrative     Current outpatient prescriptions:  .  Adalimumab (HUMIRA) 40 MG/0.8ML PSKT, Inject 40 mg into the skin every 14 (fourteen) days., Disp: , Rfl:  .  atorvastatin (LIPITOR) 20 MG tablet, Take 1 tablet (20 mg total) by mouth daily., Disp: 90 tablet, Rfl: 0 .  B Complex Vitamins (VITAMIN B COMPLEX IJ), Take 1 tablet by mouth., Disp: ,  Rfl:  .  Cholecalciferol (VITAMIN D3) 1000 UNITS CAPS, Take 1 capsule by mouth., Disp: , Rfl:  .  dutasteride (AVODART) 0.5 MG capsule, Take by mouth., Disp: , Rfl:  .  dutasteride (AVODART) 0.5 MG capsule, , Disp: , Rfl: 0 .  metoprolol succinate (TOPROL-XL) 100 MG 24 hr tablet, Take 1 tablet (100 mg total) by mouth daily., Disp: 90 tablet, Rfl: 0 .  Omega-3 Fatty Acids (FISH OIL PO), , Disp: , Rfl:  .  zolpidem (AMBIEN) 10 MG tablet, Take 0.5 tablets (5 mg total) by mouth at bedtime as needed for sleep., Disp: 30 tablet, Rfl: 0  No Known Allergies   Review of Systems  Respiratory: Negative for cough and shortness of breath.   Cardiovascular: Negative for chest pain.  Gastrointestinal: Negative for nausea and abdominal pain.  Musculoskeletal: Negative for myalgias.    Objective  Filed Vitals:   04/07/16 1003  BP: 118/81  Pulse: 86  Temp: 98.4 F (36.9 C)  TempSrc: Oral  Resp: 18  Height: 6' (1.829 m)  Weight: 185 lb 8 oz (84.142 kg)  SpO2: 96%    Physical Exam  Constitutional: He is oriented to person, place, and time and well-developed, well-nourished, and in no distress.  HENT:  Head: Normocephalic and atraumatic.  Cardiovascular: Normal rate and regular rhythm.   Pulmonary/Chest: Effort normal and breath sounds normal.  Abdominal: Soft. Bowel sounds are normal. There is no tenderness.  Neurological: He is alert and oriented to person, place, and time.  Nursing note and vitals reviewed.   Assessment & Plan  1. Hyperlipidemia  - atorvastatin (LIPITOR) 20 MG tablet; Take 1 tablet (20 mg total) by mouth daily at 6 PM.  Dispense: 90 tablet; Refill: 1 - Lipid Profile - Comprehensive Metabolic Panel (CMET)   Marci Polito Asad A. [facility] 04/07/2016 10:33 AM

## 2016-05-11 ENCOUNTER — Encounter: Payer: 59 | Admitting: Family Medicine

## 2016-05-19 ENCOUNTER — Encounter: Payer: 59 | Admitting: Family Medicine

## 2016-05-20 LAB — COMPREHENSIVE METABOLIC PANEL
ALBUMIN: 4.7 g/dL (ref 3.6–4.8)
ALK PHOS: 63 IU/L (ref 39–117)
ALT: 23 IU/L (ref 0–44)
AST: 34 IU/L (ref 0–40)
Albumin/Globulin Ratio: 2 (ref 1.2–2.2)
BILIRUBIN TOTAL: 2.1 mg/dL — AB (ref 0.0–1.2)
BUN / CREAT RATIO: 21 (ref 10–24)
BUN: 20 mg/dL (ref 8–27)
CHLORIDE: 98 mmol/L (ref 96–106)
CO2: 26 mmol/L (ref 18–29)
Calcium: 9.4 mg/dL (ref 8.6–10.2)
Creatinine, Ser: 0.96 mg/dL (ref 0.76–1.27)
GFR calc Af Amer: 96 mL/min/{1.73_m2} (ref >59)
GFR calc non Af Amer: 83 mL/min/{1.73_m2} (ref >59)
GLOBULIN, TOTAL: 2.3 g/dL (ref 1.5–4.5)
Glucose: 92 mg/dL (ref 65–99)
POTASSIUM: 4.4 mmol/L (ref 3.5–5.2)
SODIUM: 140 mmol/L (ref 134–144)
Total Protein: 7 g/dL (ref 6.0–8.5)

## 2016-05-20 LAB — LIPID PANEL
CHOLESTEROL TOTAL: 157 mg/dL (ref 100–199)
Chol/HDL Ratio: 2.5 ratio units (ref 0.0–5.0)
HDL: 63 mg/dL (ref >39)
LDL Calculated: 69 mg/dL (ref 0–99)
Triglycerides: 126 mg/dL (ref 0–149)
VLDL CHOLESTEROL CAL: 25 mg/dL (ref 5–40)

## 2016-05-26 ENCOUNTER — Encounter: Payer: Self-pay | Admitting: Family Medicine

## 2016-05-26 ENCOUNTER — Ambulatory Visit (INDEPENDENT_AMBULATORY_CARE_PROVIDER_SITE_OTHER): Payer: 59 | Admitting: Family Medicine

## 2016-05-26 DIAGNOSIS — G4700 Insomnia, unspecified: Secondary | ICD-10-CM

## 2016-05-26 DIAGNOSIS — R0683 Snoring: Secondary | ICD-10-CM | POA: Diagnosis not present

## 2016-05-26 MED ORDER — ZOLPIDEM TARTRATE 10 MG PO TABS: 5.0000 mg | ORAL_TABLET | Freq: Every evening | ORAL | Status: DC | PRN

## 2016-05-26 NOTE — Progress Notes (Signed)
Name: Jeffrey Yates   MRN: [id number]    DOB: [birth year]   Date:05/26/2016       Progress Note  Subjective  Chief Complaint  Chief Complaint  Patient presents with  . Follow-up    cholesterol    Insomnia Primary symptoms: sleep disturbance, no malaise/fatigue.  The problem is unchanged. Past treatments include medication. Typical bedtime:  10-11 P.M..  How long after going to bed to you fall asleep: over an hour.    Patient's wife tells him that he snores. Has never been tested for sleep apnea, sometimes feels fatigued or tired upon waking up in the morning.   Hyperbilirubinemia: Elevated total bilirubin levels, drawn on routine lab work 2 weeks ago. He reports history of elevated bilirubin levels and had his gallbladder removed as a result. He feels well, no abdominal pain, changes in urine or stool, fatigue, or unintentional weight loss.   Past Medical History  Diagnosis Date  . Alcohol abuse 01/06/15    Siezure from Withdrawl - 11/26/14  . Hyperlipidemia   . Hypertension   . Allergy     Past Surgical History  Procedure Laterality Date  . Cholecystectomy  2007  . Lumbar disc surgery  2007  . Vasectomy      Family History  Problem Relation Age of Onset  . Alcohol abuse Paternal Grandfather     Social History   Social History  . Marital Status: Married    Spouse Name: N/A  . Number of Children: N/A  . Years of Education: N/A   Occupational History  . Not on file.   Social History Main Topics  . Smoking status: Light Tobacco Smoker  . Smokeless tobacco: Not on file  . Alcohol Use: No  . Drug Use: No  . Sexual Activity: No   Other Topics Concern  . Not on file   Social History Narrative     Current outpatient prescriptions:  .  Adalimumab (HUMIRA) 40 MG/0.8ML PSKT, Inject 40 mg into the skin every 14 (fourteen) days., Disp: , Rfl:  .  atorvastatin (LIPITOR) 20 MG tablet, Take 1 tablet (20 mg total) by mouth daily at 6 PM., Disp: 90 tablet, Rfl: 1 .  B  Complex Vitamins (VITAMIN B COMPLEX IJ), Take 1 tablet by mouth., Disp: , Rfl:  .  Cholecalciferol (VITAMIN D3) 1000 UNITS CAPS, Take 1 capsule by mouth., Disp: , Rfl:  .  dutasteride (AVODART) 0.5 MG capsule, Take by mouth., Disp: , Rfl:  .  dutasteride (AVODART) 0.5 MG capsule, , Disp: , Rfl: 0 .  HUMIRA PEN 40 MG/0.8ML PNKT, , Disp: , Rfl:  .  metoprolol succinate (TOPROL-XL) 100 MG 24 hr tablet, Take 1 tablet (100 mg total) by mouth daily., Disp: 90 tablet, Rfl: 0 .  Omega-3 Fatty Acids (FISH OIL PO), , Disp: , Rfl:  .  zolpidem (AMBIEN) 10 MG tablet, Take 0.5 tablets (5 mg total) by mouth at bedtime as needed for sleep., Disp: 30 tablet, Rfl: 0  No Known Allergies   Review of Systems  Constitutional: Negative for fever, chills, weight loss and malaise/fatigue.  Gastrointestinal: Negative for nausea, vomiting, abdominal pain and blood in stool.  Genitourinary: Negative for dysuria.  Psychiatric/Behavioral: Positive for sleep disturbance. The patient has insomnia.     Objective  Filed Vitals:   05/26/16 0911  BP: 118/78  Pulse: 79  Temp: 98.7 F (37.1 C)  TempSrc: Oral  Resp: 16  Height: 6' (1.829 m)  Weight: 183 lb 3.2  oz (83.099 kg)  SpO2: 96%    Physical Exam  Constitutional: He is oriented to person, place, and time and well-developed, well-nourished, and in no distress.  Eyes: Conjunctivae are normal. Pupils are equal, round, and reactive to light.  Cardiovascular: Normal rate, regular rhythm and normal heart sounds.   Pulmonary/Chest: Breath sounds normal.  Abdominal: Soft. Bowel sounds are normal. There is no hepatomegaly. There is no tenderness.  Neurological: He is alert and oriented to person, place, and time.  Psychiatric: Mood, memory, affect and judgment normal.  Nursing note and vitals reviewed.     Assessment & Plan  1. Hyperbilirubinemia Workup to include total, direct, and indirect bilirubin levels, hepatic panel and INR. May need RUQ ultrasound  for complete evaluation - Bilirubin, fractionated(tot/dir/indir) - Hepatic function panel - INR/PT  2. Cannot sleep Refill for Ambien provided - zolpidem (AMBIEN) 10 MG tablet; Take 0.5 tablets (5 mg total) by mouth at bedtime as needed for sleep.  Dispense: 30 tablet; Refill: 0  3. Snoring Referral to obtain sleep study - Ambulatory referral to Sleep Studies   Jarren Para Asad A. [facility] 05/26/2016 9:32 AM

## 2016-05-29 LAB — PROTIME-INR
INR: 1.1 (ref 0.8–1.2)
Prothrombin Time: 10.9 s (ref 9.1–12.0)

## 2016-05-29 LAB — HEPATIC FUNCTION PANEL
ALBUMIN: 4.8 g/dL (ref 3.6–4.8)
ALK PHOS: 70 IU/L (ref 39–117)
ALT: 25 IU/L (ref 0–44)
AST: 45 IU/L — AB (ref 0–40)
BILIRUBIN, DIRECT: 0.41 mg/dL — AB (ref 0.00–0.40)
Bilirubin Total: 1.8 mg/dL — ABNORMAL HIGH (ref 0.0–1.2)
TOTAL PROTEIN: 7.1 g/dL (ref 6.0–8.5)

## 2016-05-31 DIAGNOSIS — M720 Palmar fascial fibromatosis [Dupuytren]: Secondary | ICD-10-CM | POA: Insufficient documentation

## 2016-06-02 NOTE — Addendum Note (Signed)
Addended byManuella Ghazi, Jarvis Sawa A A on: 06/02/2016 05:20 PM   Modules accepted: Orders

## 2016-06-04 ENCOUNTER — Telehealth: Payer: Self-pay | Admitting: Gastroenterology

## 2016-06-04 NOTE — Telephone Encounter (Signed)
I have called patient to make an appointment per referral from PCP to see GI. No answer. I have left a message on voicemail.

## 2016-06-07 NOTE — Telephone Encounter (Signed)
Patient has called back and an appointment has been made to see Dr Allen Norris.

## 2016-06-20 ENCOUNTER — Other Ambulatory Visit: Payer: Self-pay | Admitting: Family Medicine

## 2016-06-21 ENCOUNTER — Telehealth: Payer: Self-pay

## 2016-06-21 NOTE — Telephone Encounter (Signed)
Patient came in for a suture removal.  Procedure done by Delene Ruffini, CMA as well as Dr. Steele Sizer.  Patient stated he felt fine and was released.

## 2016-06-29 ENCOUNTER — Other Ambulatory Visit: Payer: Self-pay

## 2016-06-29 DIAGNOSIS — M25569 Pain in unspecified knee: Secondary | ICD-10-CM | POA: Insufficient documentation

## 2016-06-29 DIAGNOSIS — R748 Abnormal levels of other serum enzymes: Secondary | ICD-10-CM

## 2016-06-29 DIAGNOSIS — M5430 Sciatica, unspecified side: Secondary | ICD-10-CM | POA: Insufficient documentation

## 2016-06-29 HISTORY — DX: Sciatica, unspecified side: M54.30

## 2016-06-29 HISTORY — DX: Abnormal levels of other serum enzymes: R74.8

## 2016-06-30 ENCOUNTER — Ambulatory Visit (INDEPENDENT_AMBULATORY_CARE_PROVIDER_SITE_OTHER): Payer: 59 | Admitting: Gastroenterology

## 2016-06-30 ENCOUNTER — Encounter: Payer: Self-pay | Admitting: Gastroenterology

## 2016-06-30 VITALS — BP 135/95 | HR 67 | Temp 98.3°F | Ht 71.0 in | Wt 186.0 lb

## 2016-06-30 DIAGNOSIS — R17 Unspecified jaundice: Secondary | ICD-10-CM

## 2016-06-30 NOTE — Progress Notes (Signed)
Gastroenterology Consultation  Referring Provider:     Roselee Nova, MD Primary Care Physician:  Keith Rake, MD Primary Gastroenterologist:  Dr. Allen Norris     Reason for Consultation:     Abnormal liver enzymes        HPI:   Jeffrey Yates is a 65 y.o. y/o male referred for consultation & management of Abnormal liver enzymes by Dr. Keith Rake, MD.  This patient comes today with chronically elevated bilirubin. The patient's fractionation of the bilirubin showed the bilirubin to be mostly unconjugated indirect bilirubin. The patient did have an isolated increased AST recently as the only increased transaminitis. He reports that just prior to this blood tests he was away for the weekend at the beach and was drinking heavily. The patient states he usually drinks on a daily basis with up to 4 glasses of wine and as many as 2 shots. He states he has doing this for many years. There is no report of any unexplained weight loss fevers chills nausea vomiting. The patient also denies any jaundice. The patient does not have any abdominal pain. He also denies any history of drug use.   Past Medical History  Diagnosis Date  . Alcohol abuse 01/06/15    Siezure from Withdrawl - 11/26/14  . Hyperlipidemia   . Hypertension   . Allergy   . Neuralgia neuritis, sciatic nerve 06/29/2016  . Psoriasis 09/22/2015  . Gout 09/22/2015  . Seizure (Omega) 07/14/2015  . Abnormal liver enzymes 06/29/2016    Past Surgical History  Procedure Laterality Date  . Cholecystectomy  2007  . Lumbar disc surgery  2007  . Vasectomy    . Foot surgery Right   . Rotator cuff repair Left     Prior to Admission medications   Medication Sig Start Date End Date Taking? Authorizing Provider  Adalimumab (HUMIRA) 40 MG/0.8ML PSKT Inject 40 mg into the skin every 14 (fourteen) days.   Yes Historical Provider, MD  atorvastatin (LIPITOR) 20 MG tablet Take 1 tablet (20 mg total) by mouth daily at 6 PM. 04/07/16  Yes Roselee Nova, MD  B  Complex Vitamins (VITAMIN B COMPLEX IJ) Take 1 tablet by mouth.   Yes Historical Provider, MD  Cholecalciferol (VITAMIN D3) 1000 UNITS CAPS Take 1 capsule by mouth.   Yes Historical Provider, MD  dutasteride (AVODART) 0.5 MG capsule Take by mouth.   Yes Historical Provider, MD  meloxicam (MOBIC) 15 MG tablet take 1 tablet by mouth once daily with meals 05/31/16  Yes Historical Provider, MD  metoprolol succinate (TOPROL-XL) 100 MG 24 hr tablet take 1 tablet by mouth once daily 06/21/16  Yes Roselee Nova, MD  Omega-3 Fatty Acids (FISH OIL PO)    Yes Historical Provider, MD  zolpidem (AMBIEN) 10 MG tablet Take 0.5 tablets (5 mg total) by mouth at bedtime as needed for sleep. 05/26/16  Yes Roselee Nova, MD    Family History  Problem Relation Age of Onset  . Alcohol abuse Paternal Grandfather      Social History  Substance Use Topics  . Smoking status: Light Tobacco Smoker  . Smokeless tobacco: Never Used  . Alcohol Use: No    Allergies as of 06/30/2016  . (No Known Allergies)    Review of Systems:    All systems reviewed and negative except where noted in HPI.   Physical Exam:  BP 135/95 mmHg  Pulse 67  Temp(Src) 98.3 F (36.8  C) (Oral)  Ht 5\' 11"  (1.803 m)  Wt 186 lb (84.369 kg)  BMI 25.95 kg/m2 No LMP for male patient. Psych:  Alert and cooperative. Normal mood and affect. General:   Alert,  Well-developed, well-nourished, pleasant and cooperative in NAD Head:  Normocephalic and atraumatic. Eyes:  Sclera clear, no icterus.   Conjunctiva pink. Ears:  Normal auditory acuity. Nose:  No deformity, discharge, or lesions. Mouth:  No deformity or lesions,oropharynx pink & moist. Neck:  Supple; no masses or thyromegaly. Lungs:  Respirations even and unlabored.  Clear throughout to auscultation.   No wheezes, crackles, or rhonchi. No acute distress. Heart:  Regular rate and rhythm; no murmurs, clicks, rubs, or gallops. Abdomen:  Normal bowel sounds.  No bruits.  Soft,  non-tender and non-distended without masses, hepatosplenomegaly or hernias noted.  No guarding or rebound tenderness.  Negative Carnett sign.   Rectal:  Deferred.  Msk:  Symmetrical without gross deformities.  Dupuytren contractures noted. Good, equal movement & strength bilaterally. Pulses:  Normal pulses noted. Extremities:  No clubbing or edema.  No cyanosis. Neurologic:  Alert and oriented x3;  grossly normal neurologically. Skin:  Intact without significant lesions or rashes.  No jaundice. Lymph Nodes:  No significant cervical adenopathy. Psych:  Alert and cooperative. Normal mood and affect.  Imaging Studies: No results found.  Assessment and Plan:   [name] is a 65 y.o. y/o male who has a persistent elevation of his bilirubin. The patient's bilirubin was fractionated showed it to be mostly indirect bilirubin consistent with a non-liver cause of the hyperbilirubinemia. The patient did have an isolated increased AST at 45 recently that he states was taken after a alcohol binge from being at the beach with his friends. Patient will have his liver enzymes checked again today to see if the AST is still elevated. The patient's bilirubin is likely caused by Gilbert's disease. He has been explained the pathophysiology of Gilbert's disease and that there is no concern with this disease. He has also been told that if his AST or ALT are high with repeat labs today then he will need a further workup for his elevated transaminases. The patient has been explained the plan and agrees with it.   Note: This dictation was prepared with Dragon dictation along with smaller phrase technology. Any transcriptional errors that result from this process are unintentional.

## 2016-07-22 LAB — HEPATIC FUNCTION PANEL
ALBUMIN: 4.9 g/dL — AB (ref 3.6–4.8)
ALT: 22 IU/L (ref 0–44)
AST: 32 IU/L (ref 0–40)
Alkaline Phosphatase: 59 IU/L (ref 39–117)
Bilirubin Total: 1.3 mg/dL — ABNORMAL HIGH (ref 0.0–1.2)
Bilirubin, Direct: 0.3 mg/dL (ref 0.00–0.40)
TOTAL PROTEIN: 7.2 g/dL (ref 6.0–8.5)

## 2016-08-04 ENCOUNTER — Telehealth: Payer: Self-pay

## 2016-08-04 NOTE — Telephone Encounter (Signed)
Pt notified of results

## 2016-08-04 NOTE — Telephone Encounter (Signed)
-----   Message from Lucilla Lame, MD sent at 07/31/2016  5:05 PM EDT ----- Let the patient know that his liver enzymes were normal except bilirubin which has calmed down but is still elevated. The part that elevated is not from the liver.

## 2016-09-30 ENCOUNTER — Other Ambulatory Visit: Payer: Self-pay | Admitting: Family Medicine

## 2016-09-30 ENCOUNTER — Telehealth: Payer: Self-pay

## 2016-09-30 MED ORDER — METOPROLOL SUCCINATE ER 100 MG PO TB24: 100.0000 mg | ORAL_TABLET | Freq: Every day | ORAL | 0 refills | Status: DC

## 2016-09-30 NOTE — Telephone Encounter (Signed)
Medication has been refilled and sent to [facility] and patient has an appointment scheduled for 10/05/2016

## 2016-10-05 ENCOUNTER — Ambulatory Visit: Payer: 59 | Admitting: Family Medicine

## 2016-10-06 ENCOUNTER — Ambulatory Visit: Payer: 59 | Admitting: Family Medicine

## 2016-10-12 ENCOUNTER — Ambulatory Visit (INDEPENDENT_AMBULATORY_CARE_PROVIDER_SITE_OTHER): Payer: 59 | Admitting: Family Medicine

## 2016-10-12 ENCOUNTER — Encounter: Payer: Self-pay | Admitting: Family Medicine

## 2016-10-12 VITALS — BP 137/81 | HR 79 | Temp 97.9°F | Resp 16 | Ht 71.0 in | Wt 182.9 lb

## 2016-10-12 DIAGNOSIS — Z23 Encounter for immunization: Secondary | ICD-10-CM | POA: Diagnosis not present

## 2016-10-12 DIAGNOSIS — G4700 Insomnia, unspecified: Secondary | ICD-10-CM

## 2016-10-12 MED ORDER — ZOLPIDEM TARTRATE 10 MG PO TABS: 5.0000 mg | ORAL_TABLET | Freq: Every evening | ORAL | 1 refills | Status: DC | PRN

## 2016-10-12 NOTE — Progress Notes (Signed)
Name: Jeffrey Yates   MRN: [id number]    DOB: 09/11/1951   Date:10/12/2016       Progress Note  Subjective  Chief Complaint  Chief Complaint  Patient presents with  . Follow-up    3 mo    Insomnia  Primary symptoms: no sleep disturbance, no difficulty falling asleep, no frequent awakening, premature morning awakening (Sometimes wakes up prematurely and has trouble going back to sleep), no malaise/fatigue.  The onset quality is gradual. The problem occurs intermittently. Past treatments include medication. Typical bedtime:  8-10 P.M..  How long after going to bed to you fall asleep: 15-30 minutes.   PMH includes: no depression.     Past Medical History:  Diagnosis Date  . Abnormal liver enzymes 06/29/2016  . Alcohol abuse 01/06/15   Siezure from Withdrawl - 11/26/14  . Allergy   . Gout 09/22/2015  . Hyperlipidemia   . Hypertension   . Neuralgia neuritis, sciatic nerve 06/29/2016  . Psoriasis 09/22/2015  . Seizure ([place]) 07/14/2015    Past Surgical History:  Procedure Laterality Date  . CHOLECYSTECTOMY  2007  . FOOT SURGERY Right   . [place] SURGERY  2007  . ROTATOR CUFF REPAIR Left   . VASECTOMY      Family History  Problem Relation Age of Onset  . Alcohol abuse Paternal Grandfather     Social History   Social History  . Marital status: Married    Spouse name: N/A  . Number of children: N/A  . Years of education: N/A   Occupational History  . Not on file.   Social History Main Topics  . Smoking status: Light Tobacco Smoker  . Smokeless tobacco: Never Used  . Alcohol use No  . Drug use: No  . Sexual activity: No   Other Topics Concern  . Not on file   Social History Narrative  . No narrative on file     Current Outpatient Prescriptions:  .  Adalimumab (HUMIRA) 40 MG/0.8ML PSKT, Inject 40 mg into the skin every 14 (fourteen) days., Disp: , Rfl:  .  atorvastatin (LIPITOR) 20 MG tablet, Take 1 tablet (20 mg total) by mouth daily at 6 PM., Disp: 90  tablet, Rfl: 1 .  B Complex Vitamins (VITAMIN B COMPLEX IJ), Take 1 tablet by mouth., Disp: , Rfl:  .  Cholecalciferol (VITAMIN D3) 1000 UNITS CAPS, Take 1 capsule by mouth., Disp: , Rfl:  .  dutasteride (AVODART) 0.5 MG capsule, Take by mouth., Disp: , Rfl:  .  metoprolol succinate (TOPROL-XL) 100 MG 24 hr tablet, Take 1 tablet (100 mg total) by mouth daily. Take with or immediately following a meal., Disp: 90 tablet, Rfl: 0 .  Omega-3 Fatty Acids (FISH OIL PO), , Disp: , Rfl:  .  zolpidem (AMBIEN) 10 MG tablet, Take 0.5 tablets (5 mg total) by mouth at bedtime as needed for sleep., Disp: 30 tablet, Rfl: 0 .  meloxicam (MOBIC) 15 MG tablet, take 1 tablet by mouth once daily with meals, Disp: , Rfl: 0  No Known Allergies   Review of Systems  Constitutional: Positive for chills (intermittent chills in the last 2 months.). Negative for fever and malaise/fatigue.  Psychiatric/Behavioral: Negative for depression and sleep disturbance. The patient has insomnia. The patient is not nervous/anxious.      Objective  Vitals:   10/12/16 1016  BP: 137/81  Pulse: 79  Resp: 16  Temp: 97.9 F (36.6 C)  TempSrc: Oral  SpO2: 97%  Weight:  182 lb 14.4 oz (83 kg)  Height: 5\' 11"  (1.803 m)    Physical Exam  Constitutional: He is oriented to person, place, and time and well-developed, well-nourished, and in no distress.  HENT:  Head: Normocephalic and atraumatic.  Cardiovascular: Normal rate, regular rhythm and normal heart sounds.   No murmur heard. Pulmonary/Chest: Effort normal and breath sounds normal. He has no wheezes.  Neurological: He is alert and oriented to person, place, and time.  Psychiatric: Mood, memory, affect and judgment normal.  Nursing note and vitals reviewed.    Recent Results (from the past 2160 hour(s))  Hepatic function panel     Status: Abnormal   Collection Time: 07/21/16  7:52 AM  Result Value Ref Range   Total Protein 7.2 6.0 - 8.5 g/dL   Albumin 4.9 (H) 3.6  - 4.8 g/dL   Bilirubin Total 1.3 (H) 0.0 - 1.2 mg/dL   Bilirubin, Direct 0.30 0.00 - 0.40 mg/dL   Alkaline Phosphatase 59 39 - 117 IU/L   AST 32 0 - 40 IU/L   ALT 22 0 - 44 IU/L     Assessment & Plan  1. Insomnia, unspecified type Taking Ambien 10 mg half tablet when needed for premature awakenings, no reported side effects. Refills provided and follow-up in one month - zolpidem (AMBIEN) 10 MG tablet; Take 0.5 tablets (5 mg total) by mouth at bedtime as needed for sleep.  Dispense: 30 tablet; Refill: 1   2. Need for pneumococcal vaccination  - Pneumococcal conjugate vaccine 13-valent   Hurshel Bouillon Asad A. [facility] 10/12/2016 10:23 AM

## 2016-11-01 ENCOUNTER — Other Ambulatory Visit: Payer: Self-pay | Admitting: Family Medicine

## 2016-11-01 DIAGNOSIS — E785 Hyperlipidemia, unspecified: Secondary | ICD-10-CM

## 2017-01-06 ENCOUNTER — Other Ambulatory Visit: Payer: Self-pay | Admitting: Family Medicine

## 2017-02-09 ENCOUNTER — Ambulatory Visit: Payer: 59 | Admitting: Family Medicine

## 2017-02-14 ENCOUNTER — Encounter: Payer: Self-pay | Admitting: Family Medicine

## 2017-02-14 ENCOUNTER — Ambulatory Visit (INDEPENDENT_AMBULATORY_CARE_PROVIDER_SITE_OTHER): Payer: 59 | Admitting: Family Medicine

## 2017-02-14 VITALS — BP 137/83 | HR 71 | Temp 97.8°F | Resp 16 | Ht 71.0 in | Wt 192.7 lb

## 2017-02-14 DIAGNOSIS — I10 Essential (primary) hypertension: Secondary | ICD-10-CM

## 2017-02-14 DIAGNOSIS — G4700 Insomnia, unspecified: Secondary | ICD-10-CM

## 2017-02-14 DIAGNOSIS — E7800 Pure hypercholesterolemia, unspecified: Secondary | ICD-10-CM | POA: Diagnosis not present

## 2017-02-14 LAB — LIPID PANEL
CHOL/HDL RATIO: 2.4 ratio (ref <5.0)
CHOLESTEROL: 151 mg/dL (ref <200)
HDL: 64 mg/dL (ref >40)
LDL Cholesterol: 67 mg/dL (ref <100)
Triglycerides: 101 mg/dL (ref <150)
VLDL: 20 mg/dL (ref <30)

## 2017-02-14 LAB — COMPLETE METABOLIC PANEL WITH GFR
ALT: 24 U/L (ref 9–46)
AST: 31 U/L (ref 10–35)
Albumin: 4.6 g/dL (ref 3.6–5.1)
Alkaline Phosphatase: 60 U/L (ref 40–115)
BUN: 29 mg/dL — AB (ref 7–25)
CALCIUM: 9.5 mg/dL (ref 8.6–10.3)
CHLORIDE: 103 mmol/L (ref 98–110)
CO2: 30 mmol/L (ref 20–31)
CREATININE: 0.85 mg/dL (ref 0.70–1.25)
GFR, Est African American: >89 mL/min (ref >=60)
GFR, Est Non African American: >89 mL/min (ref >=60)
GLUCOSE: 92 mg/dL (ref 65–99)
POTASSIUM: 4.7 mmol/L (ref 3.5–5.3)
SODIUM: 139 mmol/L (ref 135–146)
Total Bilirubin: 2.3 mg/dL — ABNORMAL HIGH (ref 0.2–1.2)
Total Protein: 6.7 g/dL (ref 6.1–8.1)

## 2017-02-14 MED ORDER — ATORVASTATIN CALCIUM 20 MG PO TABS: ORAL_TABLET | ORAL | 0 refills | Status: DC

## 2017-02-14 MED ORDER — METOPROLOL SUCCINATE ER 100 MG PO TB24: ORAL_TABLET | ORAL | 0 refills | Status: DC

## 2017-02-14 MED ORDER — ZOLPIDEM TARTRATE 10 MG PO TABS: 5.0000 mg | ORAL_TABLET | Freq: Every evening | ORAL | 1 refills | Status: DC | PRN

## 2017-02-14 NOTE — Progress Notes (Signed)
Name: Jeffrey Yates   MRN: [id number]    DOB: 28-Jul-1951   Date:02/14/2017       Progress Note  Subjective  Chief Complaint  Chief Complaint  Patient presents with  . Follow-up    4 mo    Insomnia  Primary symptoms: sleep disturbance, no malaise/fatigue.  The problem is unchanged. Past treatments include medication. Typical bedtime:  8-10 P.M..  How long after going to bed to you fall asleep: 15-30 minutes.   PMH includes: hypertension.  Hyperlipidemia  This is a chronic problem. The problem is controlled. Recent lipid tests were reviewed and are normal. Pertinent negatives include no chest pain, leg pain, myalgias or shortness of breath. Current antihyperlipidemic treatment includes statins.  Hypertension  This is a chronic problem. The problem is unchanged. The problem is controlled. Pertinent negatives include no blurred vision, chest pain, headaches, malaise/fatigue, palpitations or shortness of breath. Past treatments include beta blockers. There is no history of kidney disease, CAD/MI or CVA.    Past Medical History:  Diagnosis Date  . Abnormal liver enzymes 06/29/2016  . Alcohol abuse 01/06/15   Siezure from Withdrawl - 11/26/14  . Allergy   . Gout 09/22/2015  . Hyperlipidemia   . Hypertension   . Neuralgia neuritis, sciatic nerve 06/29/2016  . Psoriasis 09/22/2015  . Seizure ([place]) 07/14/2015    Past Surgical History:  Procedure Laterality Date  . CHOLECYSTECTOMY  2007  . FOOT SURGERY Right   . [place] SURGERY  2007  . ROTATOR CUFF REPAIR Left   . VASECTOMY      Family History  Problem Relation Age of Onset  . Alcohol abuse Paternal Grandfather     Social History   Social History  . Marital status: Married    Spouse name: N/A  . Number of children: N/A  . Years of education: N/A   Occupational History  . Not on file.   Social History Main Topics  . Smoking status: Light Tobacco Smoker  . Smokeless tobacco: Never Used  . Alcohol use No  . Drug use:  No  . Sexual activity: No   Other Topics Concern  . Not on file   Social History Narrative  . No narrative on file     Current Outpatient Prescriptions:  .  Adalimumab (HUMIRA) 40 MG/0.8ML PSKT, Inject 40 mg into the skin every 14 (fourteen) days., Disp: , Rfl:  .  atorvastatin (LIPITOR) 20 MG tablet, take 1 tablet by mouth once daily AT 6PM, Disp: 90 tablet, Rfl: 1 .  B Complex Vitamins (VITAMIN B COMPLEX IJ), Take 1 tablet by mouth., Disp: , Rfl:  .  Cholecalciferol (VITAMIN D3) 1000 UNITS CAPS, Take 1 capsule by mouth., Disp: , Rfl:  .  dutasteride (AVODART) 0.5 MG capsule, Take by mouth., Disp: , Rfl:  .  meloxicam (MOBIC) 15 MG tablet, take 1 tablet by mouth once daily with meals, Disp: , Rfl: 0 .  metoprolol succinate (TOPROL-XL) 100 MG 24 hr tablet, take 1 tablet by mouth once daily WITH OR IMMEDIATELY FOLLOWING A MEAL, Disp: 90 tablet, Rfl: 0 .  Omega-3 Fatty Acids (FISH OIL PO), , Disp: , Rfl:  .  zolpidem (AMBIEN) 10 MG tablet, Take 0.5 tablets (5 mg total) by mouth at bedtime as needed for sleep., Disp: 30 tablet, Rfl: 1  No Known Allergies   Review of Systems  Constitutional: Negative for malaise/fatigue.  Eyes: Negative for blurred vision.  Respiratory: Negative for shortness of breath.  Cardiovascular: Negative for chest pain and palpitations.  Musculoskeletal: Negative for myalgias.  Neurological: Negative for headaches.  Psychiatric/Behavioral: Positive for sleep disturbance. The patient has insomnia.       Objective  Vitals:   02/14/17 1136  BP: 137/83  Pulse: 71  Resp: 16  Temp: 97.8 F (36.6 C)  TempSrc: Oral  SpO2: 96%  Weight: 192 lb 11.2 oz (87.4 kg)  Height: 5\' 11"  (1.803 m)    Physical Exam  Constitutional: He is oriented to person, place, and time and well-developed, well-nourished, and in no distress.  HENT:  Head: Normocephalic and atraumatic.  Eyes: Conjunctivae are normal. Pupils are equal, round, and reactive to light.   Cardiovascular: Normal rate, regular rhythm and normal heart sounds.   Pulmonary/Chest: Breath sounds normal.  Abdominal: Soft. Bowel sounds are normal. There is no hepatomegaly. There is no tenderness.  Musculoskeletal: Normal range of motion.  Neurological: He is alert and oriented to person, place, and time.  Psychiatric: Mood, memory, affect and judgment normal.  Nursing note and vitals reviewed.    Assessment & Plan  1. Pure hypercholesterolemia  - atorvastatin (LIPITOR) 20 MG tablet; take 1 tablet by mouth once daily AT 6PM  Dispense: 90 tablet; Refill: 0 - Lipid panel - COMPLETE METABOLIC PANEL WITH GFR  2. Insomnia, unspecified type  - zolpidem (AMBIEN) 10 MG tablet; Take 0.5 tablets (5 mg total) by mouth at bedtime as needed for sleep.  Dispense: 30 tablet; Refill: 1  3. Essential hypertension  - metoprolol succinate (TOPROL-XL) 100 MG 24 hr tablet; take 1 tablet by mouth once daily WITH OR IMMEDIATELY FOLLOWING A MEAL  Dispense: 90 tablet; Refill: 0   Milliani Herrada Asad A. [facility] 02/14/2017 11:45 AM

## 2017-02-16 ENCOUNTER — Other Ambulatory Visit: Payer: Self-pay | Admitting: Emergency Medicine

## 2017-03-14 ENCOUNTER — Ambulatory Visit: Payer: Self-pay | Admitting: Gastroenterology

## 2017-04-11 ENCOUNTER — Ambulatory Visit: Payer: 59 | Admitting: Gastroenterology

## 2017-04-18 ENCOUNTER — Encounter: Payer: 59 | Admitting: Family Medicine

## 2017-05-23 ENCOUNTER — Encounter: Payer: 59 | Admitting: Family Medicine

## 2017-06-04 ENCOUNTER — Emergency Department
Admission: EM | Admit: 2017-06-04 | Discharge: 2017-06-04 | Disposition: A | Discharge status: Home / Self Care | Payer: 59 | Attending: Emergency Medicine | Admitting: Emergency Medicine

## 2017-06-04 ENCOUNTER — Encounter: Payer: Self-pay | Admitting: Emergency Medicine

## 2017-06-04 DIAGNOSIS — L509 Urticaria, unspecified: Secondary | ICD-10-CM

## 2017-06-04 DIAGNOSIS — T783XXA Angioneurotic edema, initial encounter: Secondary | ICD-10-CM

## 2017-06-04 DIAGNOSIS — T7840XA Allergy, unspecified, initial encounter: Secondary | ICD-10-CM | POA: Insufficient documentation

## 2017-06-04 DIAGNOSIS — I10 Essential (primary) hypertension: Secondary | ICD-10-CM | POA: Diagnosis not present

## 2017-06-04 DIAGNOSIS — Z79899 Other long term (current) drug therapy: Secondary | ICD-10-CM | POA: Insufficient documentation

## 2017-06-04 DIAGNOSIS — T782XXA Anaphylactic shock, unspecified, initial encounter: Secondary | ICD-10-CM

## 2017-06-04 MED ORDER — FAMOTIDINE 40 MG PO TABS: 40.0000 mg | ORAL_TABLET | Freq: Every evening | ORAL | 1 refills | Status: DC

## 2017-06-04 MED ORDER — METHYLPREDNISOLONE SODIUM SUCC 125 MG IJ SOLR
125.0000 mg | Freq: Once | INTRAMUSCULAR | Status: AC
  Administered 2017-06-04: 125 mg via INTRAVENOUS

## 2017-06-04 MED ORDER — FAMOTIDINE IN NACL 20-0.9 MG/50ML-% IV SOLN
20.0000 mg | Freq: Once | INTRAVENOUS | Status: AC
  Administered 2017-06-04: 20 mg via INTRAVENOUS

## 2017-06-04 MED ORDER — DIPHENHYDRAMINE HCL 50 MG/ML IJ SOLN
INTRAMUSCULAR | Status: AC
  Filled 2017-06-04: qty 1

## 2017-06-04 MED ORDER — METHYLPREDNISOLONE SODIUM SUCC 125 MG IJ SOLR
INTRAMUSCULAR | Status: AC
  Filled 2017-06-04: qty 2

## 2017-06-04 MED ORDER — EPINEPHRINE 0.3 MG/0.3ML IJ SOAJ: 0.3000 mg | Freq: Once | INTRAMUSCULAR | 0 refills | Status: AC

## 2017-06-04 MED ORDER — DIPHENHYDRAMINE HCL 50 MG/ML IJ SOLN
25.0000 mg | Freq: Once | INTRAMUSCULAR | Status: AC
  Administered 2017-06-04: 25 mg via INTRAVENOUS

## 2017-06-04 MED ORDER — SODIUM CHLORIDE 0.9 % IV BOLUS (SEPSIS)
1000.0000 mL | Freq: Once | INTRAVENOUS | Status: AC
  Administered 2017-06-04: 1000 mL via INTRAVENOUS

## 2017-06-04 MED ORDER — PREDNISONE 20 MG PO TABS: 60.0000 mg | ORAL_TABLET | Freq: Every day | ORAL | 0 refills | Status: DC

## 2017-06-04 MED ORDER — FAMOTIDINE IN NACL 20-0.9 MG/50ML-% IV SOLN
INTRAVENOUS | Status: AC
  Filled 2017-06-04: qty 50

## 2017-06-04 NOTE — ED Triage Notes (Addendum)
Patient was itching last night and woke up with large hives all over body and swollen lip. Pt denies any new foods, new medications or soaps. Pt feels a little loopy and jittery.

## 2017-06-04 NOTE — ED Provider Notes (Signed)
[facility] Emergency Department Provider Note   ____________________________________________   First MD Initiated Contact with Patient 06/04/17 [phone]     (approximate)  I have reviewed the triage vital signs and the nursing notes.   HISTORY  Chief Complaint Allergic Reaction    HPI Jeffrey Yates is a 66 y.o. male who comes into the hospital today with an allergic reaction. He states that he started itching last night around 12:31 AM. He noticed that his arms are red on the insides. He went to bed and woke up with a lump on his lip. He felt a little dizzy and noticed some hives on his arms were he had been itching. The patient reports that he's never had these symptoms before. He ate some rare the last night and a BLT for lunch. The patient denies any new medications. He denies any tongue swelling or throat swelling and he has no difficulty breathing. The patient is here today for evaluation of his symptoms.   Past Medical History:  Diagnosis Date  . Abnormal liver enzymes 06/29/2016  . Alcohol abuse 01/06/15   Siezure from Withdrawl - 11/26/14  . Allergy   . Gout 09/22/2015  . Hyperlipidemia   . Hypertension   . Neuralgia neuritis, sciatic nerve 06/29/2016  . Psoriasis 09/22/2015  . Seizure ([place]) 07/14/2015    Patient Active Problem List   Diagnosis Date Noted  . Neuralgia neuritis, sciatic nerve 06/29/2016  . Gonalgia 06/29/2016  . Abnormal liver enzymes 06/29/2016  . Hyperbilirubinemia 05/26/2016  . Snoring 05/26/2016  . Cannot sleep 10/20/2015  . Gout 09/22/2015  . Psoriasis 09/22/2015  . Hypertension 07/14/2015  . Hyperlipidemia 07/14/2015  . History of gout 07/14/2015  . AA (alcohol abuse) 07/14/2015  . Allergic rhinitis 07/14/2015  . Hypercholesteremia 07/14/2015  . Seizure ([place]) 07/14/2015    Past Surgical History:  Procedure Laterality Date  . CHOLECYSTECTOMY  2007  . FOOT SURGERY Right   . [place] SURGERY  2007  . ROTATOR  CUFF REPAIR Left   . VASECTOMY      Prior to Admission medications   Medication Sig Start Date End Date Taking? Authorizing Provider  Adalimumab (HUMIRA) 40 MG/0.8ML PSKT Inject 40 mg into the skin every 14 (fourteen) days.   Yes [provider]  atorvastatin (LIPITOR) 20 MG tablet take 1 tablet by mouth once daily AT 6PM 02/14/17  Yes Roselee Nova, MD  B Complex Vitamins (VITAMIN B COMPLEX IJ) Take 1 tablet by mouth.   Yes [provider]  Cholecalciferol (VITAMIN D3) 1000 UNITS CAPS Take 1 capsule by mouth.   Yes [provider]  dutasteride (AVODART) 0.5 MG capsule Take 0.5 mg by mouth daily.    Yes [provider]  meloxicam (MOBIC) 15 MG tablet take 1 tablet by mouth once daily with meals 05/31/16  Yes [provider]  metoprolol succinate (TOPROL-XL) 100 MG 24 hr tablet take 1 tablet by mouth once daily WITH OR IMMEDIATELY FOLLOWING A MEAL 02/14/17  Yes Roselee Nova, MD  Omega-3 Fatty Acids (FISH OIL PO) Take 1 capsule by mouth daily.    Yes [provider]  zolpidem (AMBIEN) 10 MG tablet Take 0.5 tablets (5 mg total) by mouth at bedtime as needed for sleep. 02/14/17  Yes Roselee Nova, MD    Allergies Patient has no known allergies.  Family History  Problem Relation Age of Onset  . Alcohol abuse Paternal Grandfather  Social History Social History  Substance Use Topics  . Smoking status: Light Tobacco Smoker    Packs/day: 0.50  . Smokeless tobacco: Never Used  . Alcohol use Yes    Review of Systems  Constitutional: No fever/chills Eyes: No visual changes. ENT: Lip swelling Cardiovascular: Denies chest pain. Respiratory: Denies shortness of breath. Gastrointestinal: No abdominal pain.  No nausea, no vomiting.  No diarrhea.  No constipation. Genitourinary: Negative for dysuria. Musculoskeletal: Negative for back pain. Skin: Hives Neurological: Negative for headaches, focal weakness or  numbness.   ____________________________________________   PHYSICAL EXAM:  VITAL SIGNS: ED Triage Vitals [06/04/17 0626]  Enc Vitals Group     BP      Pulse      Resp      Temp      Temp src      SpO2      Weight 185 lb (83.9 kg)     Height 5\' 10"  (1.778 m)     Head Circumference      Peak Flow      Pain Score      Pain Loc      Pain Edu?      Excl. in [place]?     Constitutional: Alert and oriented. Well appearing and in mild distress. Eyes: Conjunctivae are normal. PERRL. EOMI. Head: Atraumatic. Nose: No congestion/rhinnorhea. Mouth/Throat: Mucous membranes are moist.  Oropharynx non-erythematous. Swelling to upper lip Cardiovascular: Normal rate, regular rhythm. Grossly normal heart sounds.  Good peripheral circulation. Respiratory: Normal respiratory effort.  No retractions. Lungs CTAB. Gastrointestinal: Soft and nontender. No distention. Positive bowel sounds Musculoskeletal: No lower extremity tenderness nor edema.   Neurologic:  Normal speech and language.  Skin:  Skin is warm, dry and intact. Hives to bilateral arms Psychiatric: Mood and affect are normal.   ____________________________________________   LABS (all labs ordered are listed, but only abnormal results are displayed)  Labs Reviewed - No data to display ____________________________________________  EKG  none ____________________________________________  RADIOLOGY  No results found.  ____________________________________________   PROCEDURES  Procedure(s) performed: None  Procedures  Critical Care performed: No  ____________________________________________   INITIAL IMPRESSION / ASSESSMENT AND PLAN / ED COURSE  Pertinent labs & imaging results that were available during my care of the patient were reviewed by me and considered in my medical decision making (see chart for details).  This is a 66 year old male who comes in today with an allergic reaction. The patient has swelling  to his upper lip as well as a rash to his bilateral arms. I did give the patient some Solu-Medrol, Benadryl and Pepcid. The patient will be reassessed. The patient's care will be signed out to Dr. Burlene Arnt who will reassess the patient and disposition the patient.     ----------------------------------------- 7:01 AM on 06/04/2017 -----------------------------------------   OBSERVATION CARE: This patient is being placed under observation care for the following reasons: Allergic reaction/anaphylaxis requiring treatment and serial exams to determine response to treatment     ____________________________________________   FINAL CLINICAL IMPRESSION(S) / ED DIAGNOSES  Final diagnoses:  Allergic reaction, initial encounter  Hives  Anaphylaxis, initial encounter      NEW MEDICATIONS STARTED DURING THIS VISIT:  New Prescriptions   No medications on file     Note:  This document was prepared using Dragon voice recognition software and may include unintentional dictation errors.    Loney Hering, MD 06/04/17 [phone]

## 2017-06-06 ENCOUNTER — Ambulatory Visit (INDEPENDENT_AMBULATORY_CARE_PROVIDER_SITE_OTHER): Payer: 59 | Admitting: Family Medicine

## 2017-06-06 ENCOUNTER — Encounter: Payer: Self-pay | Admitting: Family Medicine

## 2017-06-06 VITALS — BP 132/80 | HR 67 | Temp 98.1°F | Resp 14 | Wt 188.0 lb

## 2017-06-06 DIAGNOSIS — T781XXD Other adverse food reactions, not elsewhere classified, subsequent encounter: Secondary | ICD-10-CM

## 2017-06-06 NOTE — Progress Notes (Signed)
Name: Jeffrey Yates   MRN: [id number]    DOB: 04-15-1951   Date:06/06/2017       Progress Note  Subjective  Chief Complaint  Chief Complaint  Patient presents with  . Allergic Reaction    rash on thighs and arms, with lip swelling.  Went to ER     Allergic Reaction  This is a new problem. The current episode started 3 to 5 days ago. Episode frequency: started with itching on the legs 3 night ago, then started having hives on both arms, by Saturday morning, his upper lip stated swelling and thats when he sought medical care. The problem has been gradually improving since onset. The problem is moderate. The patient was exposed to food (ate Cheryln Manly at [employer] in [place] Associated symptoms include a rash. Pertinent negatives include no coughing, difficulty breathing, eye itching, stridor, trouble swallowing, vomiting or wheezing. Past treatments include oral corticosteroid and diphenhydramine. The treatment provided moderate relief. His past medical history is significant for food allergies.    Past Medical History:  Diagnosis Date  . Abnormal liver enzymes 06/29/2016  . Alcohol abuse 01/06/15   Siezure from Withdrawl - 11/26/14  . Allergy   . Gout 09/22/2015  . Hyperlipidemia   . Hypertension   . Neuralgia neuritis, sciatic nerve 06/29/2016  . Psoriasis 09/22/2015  . Seizure ([place]) 07/14/2015    Past Surgical History:  Procedure Laterality Date  . CHOLECYSTECTOMY  2007  . FOOT SURGERY Right   . [place] SURGERY  2007  . ROTATOR CUFF REPAIR Left   . VASECTOMY      Family History  Problem Relation Age of Onset  . Alcohol abuse Paternal Grandfather     Social History   Social History  . Marital status: Married    Spouse name: N/A  . Number of children: N/A  . Years of education: N/A   Occupational History  . Not on file.   Social History Main Topics  . Smoking status: Light Tobacco Smoker    Packs/day: 0.50  . Smokeless tobacco: Never Used  . Alcohol use Yes   . Drug use: No  . Sexual activity: No   Other Topics Concern  . Not on file   Social History Narrative  . No narrative on file     Current Outpatient Prescriptions:  .  Adalimumab (HUMIRA) 40 MG/0.8ML PSKT, Inject 40 mg into the skin every 14 (fourteen) days., Disp: , Rfl:  .  atorvastatin (LIPITOR) 20 MG tablet, take 1 tablet by mouth once daily AT 6PM, Disp: 90 tablet, Rfl: 0 .  B Complex Vitamins (VITAMIN B COMPLEX IJ), Take 1 tablet by mouth., Disp: , Rfl:  .  Cholecalciferol (VITAMIN D3) 1000 UNITS CAPS, Take 1 capsule by mouth., Disp: , Rfl:  .  dutasteride (AVODART) 0.5 MG capsule, Take 0.5 mg by mouth daily. , Disp: , Rfl:  .  famotidine (PEPCID) 40 MG tablet, Take 1 tablet (40 mg total) by mouth every evening., Disp: 30 tablet, Rfl: 1 .  meloxicam (MOBIC) 15 MG tablet, take 1 tablet by mouth once daily with meals, Disp: , Rfl: 0 .  metoprolol succinate (TOPROL-XL) 100 MG 24 hr tablet, take 1 tablet by mouth once daily WITH OR IMMEDIATELY FOLLOWING A MEAL, Disp: 90 tablet, Rfl: 0 .  Omega-3 Fatty Acids (FISH OIL PO), Take 1 capsule by mouth daily. , Disp: , Rfl:  .  predniSONE (DELTASONE) 20 MG tablet, Take 3 tablets (60 mg total)  by mouth daily., Disp: 12 tablet, Rfl: 0 .  zolpidem (AMBIEN) 10 MG tablet, Take 0.5 tablets (5 mg total) by mouth at bedtime as needed for sleep., Disp: 30 tablet, Rfl: 1  No Known Allergies   Review of Systems  HENT: Negative for trouble swallowing.   Eyes: Negative for itching.  Respiratory: Negative for cough, wheezing and stridor.   Gastrointestinal: Negative for vomiting.  Skin: Positive for rash.     Objective  Vitals:   06/06/17 0949  BP: 132/80  Pulse: 67  Resp: 14  Temp: 98.1 F (36.7 C)  TempSrc: Oral  SpO2: 96%  Weight: 188 lb (85.3 kg)    Physical Exam  Constitutional: He is oriented to person, place, and time and well-developed, well-nourished, and in no distress.  HENT:  Head: Normocephalic and atraumatic.   Mouth/Throat: Oropharynx is clear and moist.  Cardiovascular: Normal rate, regular rhythm and normal heart sounds.   No murmur heard. Pulmonary/Chest: Effort normal and breath sounds normal. He has no wheezes.  Abdominal: Soft. Bowel sounds are normal. There is no tenderness.  Musculoskeletal: He exhibits no edema.  Neurological: He is alert and oriented to person, place, and time.  Psychiatric: Mood, memory, affect and judgment normal.  Nursing note and vitals reviewed.     Assessment & Plan  1. Allergic reaction to food, subsequent encounter Reviewed ER notes, patient is asymptomatic at this time, no recurrence of rash, likely due to allergy to seafood. Has been referred to allergist for evaluation   Dusti Tetro Asad A. [facility] 06/06/2017 10:08 AM

## 2017-06-24 ENCOUNTER — Other Ambulatory Visit: Payer: Self-pay | Admitting: Family Medicine

## 2017-06-24 DIAGNOSIS — I10 Essential (primary) hypertension: Secondary | ICD-10-CM

## 2017-07-04 ENCOUNTER — Emergency Department
Admission: EM | Admit: 2017-07-04 | Discharge: 2017-07-04 | Disposition: A | Discharge status: Home / Self Care | Payer: 59 | Attending: Emergency Medicine | Admitting: Emergency Medicine

## 2017-07-04 ENCOUNTER — Encounter: Payer: Self-pay | Admitting: Emergency Medicine

## 2017-07-04 DIAGNOSIS — I10 Essential (primary) hypertension: Secondary | ICD-10-CM | POA: Insufficient documentation

## 2017-07-04 DIAGNOSIS — Z79899 Other long term (current) drug therapy: Secondary | ICD-10-CM | POA: Diagnosis not present

## 2017-07-04 DIAGNOSIS — M2550 Pain in unspecified joint: Secondary | ICD-10-CM | POA: Diagnosis not present

## 2017-07-04 DIAGNOSIS — R21 Rash and other nonspecific skin eruption: Secondary | ICD-10-CM | POA: Diagnosis not present

## 2017-07-04 DIAGNOSIS — F17200 Nicotine dependence, unspecified, uncomplicated: Secondary | ICD-10-CM | POA: Diagnosis not present

## 2017-07-04 DIAGNOSIS — Z791 Long term (current) use of non-steroidal anti-inflammatories (NSAID): Secondary | ICD-10-CM | POA: Diagnosis not present

## 2017-07-04 MED ORDER — METHYLPREDNISOLONE SODIUM SUCC 125 MG IJ SOLR
125.0000 mg | Freq: Once | INTRAMUSCULAR | Status: AC
  Administered 2017-07-04: 125 mg via INTRAMUSCULAR
  Filled 2017-07-04: qty 2

## 2017-07-04 MED ORDER — DIPHENHYDRAMINE HCL 50 MG/ML IJ SOLN
25.0000 mg | Freq: Once | INTRAMUSCULAR | Status: AC
  Administered 2017-07-04: 25 mg via INTRAMUSCULAR
  Filled 2017-07-04: qty 1

## 2017-07-04 MED ORDER — PREDNISONE 50 MG PO TABS: ORAL_TABLET | ORAL | 0 refills | Status: AC

## 2017-07-04 MED ORDER — NAPROXEN 500 MG PO TABS: 500.0000 mg | ORAL_TABLET | Freq: Two times a day (BID) | ORAL | 0 refills | Status: DC

## 2017-07-04 MED ORDER — RANITIDINE HCL 150 MG PO TABS: 150.0000 mg | ORAL_TABLET | Freq: Two times a day (BID) | ORAL | 0 refills | Status: DC

## 2017-07-04 MED ORDER — FAMOTIDINE 20 MG PO TABS
20.0000 mg | ORAL_TABLET | Freq: Once | ORAL | Status: AC
  Administered 2017-07-04: 20 mg via ORAL
  Filled 2017-07-04: qty 1

## 2017-07-04 NOTE — ED Provider Notes (Signed)
[facility] Emergency Department Provider Note  ____________________________________________  Time seen: Approximately 9:27 AM  I have reviewed the triage vital signs and the nursing notes.   HISTORY  Chief Complaint Rash    HPI Jeffrey Yates is a 66 y.o. male presents to emergency department with a rash for 2 days. He had a rash one month ago but this rash seems different. Rash is not itchy like the last rash. This time his joints feel sor.. Both of his wrists and elbows hurt. He had difficulty sleeping last night because he could not get comfortable. No alleviating measures have been attempted. No new foods, lotions, soaps, detergents, medications. No tick or insect bites. He has a history of gout, arthritis, psoriasis. She denies fever, headache, shortness of breath, chest pain, nausea, vomiting, abdominal pain.   Past Medical History:  Diagnosis Date  . Abnormal liver enzymes 06/29/2016  . Alcohol abuse 01/06/15   Siezure from Withdrawl - 11/26/14  . Allergy   . Gout 09/22/2015  . Hyperlipidemia   . Hypertension   . Neuralgia neuritis, sciatic nerve 06/29/2016  . Psoriasis 09/22/2015  . Seizure ([place]) 07/14/2015    Patient Active Problem List   Diagnosis Date Noted  . Neuralgia neuritis, sciatic nerve 06/29/2016  . Gonalgia 06/29/2016  . Abnormal liver enzymes 06/29/2016  . Hyperbilirubinemia 05/26/2016  . Snoring 05/26/2016  . Cannot sleep 10/20/2015  . Gout 09/22/2015  . Psoriasis 09/22/2015  . Hypertension 07/14/2015  . Hyperlipidemia 07/14/2015  . History of gout 07/14/2015  . AA (alcohol abuse) 07/14/2015  . Allergic rhinitis 07/14/2015  . Hypercholesteremia 07/14/2015  . Seizure ([place]) 07/14/2015    Past Surgical History:  Procedure Laterality Date  . CHOLECYSTECTOMY  2007  . FOOT SURGERY Right   . [place] SURGERY  2007  . ROTATOR CUFF REPAIR Left   . VASECTOMY      Prior to Admission medications   Medication Sig Start Date  End Date Taking? Authorizing Provider  Adalimumab (HUMIRA) 40 MG/0.8ML PSKT Inject 40 mg into the skin every 14 (fourteen) days.    [provider]  atorvastatin (LIPITOR) 20 MG tablet take 1 tablet by mouth once daily AT 6PM 02/14/17   Roselee Nova, MD  B Complex Vitamins (VITAMIN B COMPLEX IJ) Take 1 tablet by mouth.    [provider]  Cholecalciferol (VITAMIN D3) 1000 UNITS CAPS Take 1 capsule by mouth.    [provider]  dutasteride (AVODART) 0.5 MG capsule Take 0.5 mg by mouth daily.     [provider]  famotidine (PEPCID) 40 MG tablet Take 1 tablet (40 mg total) by mouth every evening. 06/04/17 06/04/18  Schuyler Amor, MD  meloxicam (MOBIC) 15 MG tablet take 1 tablet by mouth once daily with meals 05/31/16   [provider]  metoprolol succinate (TOPROL-XL) 100 MG 24 hr tablet take 1 tablet by mouth once daily with meals 06/24/17   Roselee Nova, MD  naproxen (NAPROSYN) 500 MG tablet Take 1 tablet (500 mg total) by mouth 2 (two) times daily with a meal. 07/04/17 07/04/18  Laban Emperor, PA-C  Omega-3 Fatty Acids (FISH OIL PO) Take 1 capsule by mouth daily.     [provider]  predniSONE (DELTASONE) 50 MG tablet Take 1 tablet for 4 days 07/05/17 07/07/17  Laban Emperor, PA-C  ranitidine (ZANTAC) 150 MG tablet Take 1 tablet (150 mg total) by mouth 2 (two) times daily. 07/04/17 07/04/18  Earleen Newport,  Caryl Pina, PA-C  zolpidem (AMBIEN) 10 MG tablet Take 0.5 tablets (5 mg total) by mouth at bedtime as needed for sleep. 02/14/17   Roselee Nova, MD    Allergies Patient has no known allergies.  Family History  Problem Relation Age of Onset  . Alcohol abuse Paternal Grandfather     Social History Social History  Substance Use Topics  . Smoking status: Light Tobacco Smoker    Packs/day: 0.50  . Smokeless tobacco: Never Used  . Alcohol use Yes     Review of Systems  Constitutional: No fever/chills Cardiovascular: No chest  pain. Respiratory: No cough. No SOB. Gastrointestinal: No abdominal pain.  No nausea, no vomiting.  Musculoskeletal: Positive for joint pain. Skin: Negative for abrasions, lacerations, ecchymosis. Positive rash. Neurological: Negative for headaches, numbness or tingling   ____________________________________________   PHYSICAL EXAM:  VITAL SIGNS: ED Triage Vitals  Enc Vitals Group     BP 07/04/17 0839 (!) 146/91     Pulse Rate 07/04/17 0839 64     Resp 07/04/17 0839 15     Temp 07/04/17 0839 99.4 F (37.4 C)     Temp Source 07/04/17 0839 Oral     SpO2 07/04/17 0839 100 %     Weight 07/04/17 0840 185 lb (83.9 kg)     Height 07/04/17 0840 5\' 10"  (1.778 m)     Head Circumference --      Peak Flow --      Pain Score --      Pain Loc --      Pain Edu? --      Excl. in [place]? --      Constitutional: Alert and oriented. Well appearing and in no acute distress. Eyes: Conjunctivae are normal. PERRL. EOMI. Head: Atraumatic. ENT:      Ears:      Nose: No congestion/rhinnorhea.      Mouth/Throat: Mucous membranes are moist.  Neck: No stridor.  Cardiovascular: Normal rate, regular rhythm.  Good peripheral circulation. Respiratory: Normal respiratory effort without tachypnea or retractions. Lungs CTAB. Good air entry to the bases with no decreased or absent breath sounds. Gastrointestinal: Bowel sounds 4 quadrants. Soft and nontender to palpation.  Musculoskeletal: Full range of motion to all extremities. No gross deformities appreciated. No erythema or warmth over joints. No difficulty moving any joints. Nontender to palpation. No swelling of fingers or toes. Neurologic:  Normal speech and language. No gross focal neurologic deficits are appreciated.  Skin:  Skin is warm, dry and intact. Erythematous patches and nodules over bilateral arms and lower legs. Psychiatric: Mood and affect are normal. Speech and behavior are normal. Patient exhibits appropriate insight and  judgement.   ____________________________________________   LABS (all labs ordered are listed, but only abnormal results are displayed)  Labs Reviewed - No data to display ____________________________________________  EKG   ____________________________________________  RADIOLOGY   No results found.  ____________________________________________    PROCEDURES  Procedure(s) performed:    Procedures    Medications  methylPREDNISolone sodium succinate (SOLU-MEDROL) 125 mg/2 mL injection 125 mg (125 mg Intramuscular Given 07/04/17 0953)  diphenhydrAMINE (BENADRYL) injection 25 mg (25 mg Intramuscular Given 07/04/17 0953)  famotidine (PEPCID) tablet 20 mg (20 mg Oral Given 07/04/17 0952)     ____________________________________________   INITIAL IMPRESSION / ASSESSMENT AND PLAN / ED COURSE  Pertinent labs & imaging results that were available during my care of the patient were reviewed by me and considered in my medical decision making (see  chart for details).  Review of the [place] CSRS was performed in accordance of the [place] prior to dispensing any controlled drugs.   She presented to the emergency department for evaluation of a rash for 2 days. Vital signs and exam are reassuring. He has full range of motion of all joints. No erythema or tenderness to palpation over joints. Rash appears afflicted inflammatory. He was given Medrol and IM Benadryl. Patient will be discharged home with prescriptions for prednisone and Benadryl. Patient is to follow up with PCP as directed. Patient is given ED precautions to return to the ED for any worsening or new symptoms.     ____________________________________________  FINAL CLINICAL IMPRESSION(S) / ED DIAGNOSES  Final diagnoses:  Rash  Arthralgia of multiple joints      NEW MEDICATIONS STARTED DURING THIS VISIT:  Discharge Medication List as of 07/04/2017 10:21 AM    START taking these medications   Details  naproxen  (NAPROSYN) 500 MG tablet Take 1 tablet (500 mg total) by mouth 2 (two) times daily with a meal., Starting Mon 07/04/2017, Until Tue 07/04/2018, Print    ranitidine (ZANTAC) 150 MG tablet Take 1 tablet (150 mg total) by mouth 2 (two) times daily., Starting Mon 07/04/2017, Until Tue 07/04/2018, Print            This chart was dictated using voice recognition software/Dragon. Despite best efforts to proofread, errors can occur which can change the meaning. Any change was purely unintentional.    Laban Emperor, PA-C 07/05/17 1302    Nena Polio, MD 07/06/17 [phone]

## 2017-07-04 NOTE — ED Triage Notes (Signed)
Pt here for generalized red rash.  Has hx of same and reports went away with prednisone pack.  Was seen at [facility] and allergy test done with no allergies noted per test.  No itching, pt denies pain at this time.  No fevers.  ambulatory without difficulty. No new products. Has not been exposed to anything he is aware of. No tick bites.

## 2017-07-04 NOTE — ED Notes (Signed)
See triage note  States he developed a generalized rash yesterday  Hx of same about 1 month ago  But this episode is more painful   The pain is mainly at joints

## 2017-07-20 ENCOUNTER — Ambulatory Visit: Payer: 59 | Admitting: Family Medicine

## 2017-08-10 ENCOUNTER — Encounter: Payer: Self-pay | Admitting: Family Medicine

## 2017-08-10 ENCOUNTER — Ambulatory Visit (INDEPENDENT_AMBULATORY_CARE_PROVIDER_SITE_OTHER): Payer: 59 | Admitting: Family Medicine

## 2017-08-10 VITALS — BP 140/77 | HR 86 | Temp 97.9°F | Resp 16 | Ht 70.0 in | Wt 180.3 lb

## 2017-08-10 DIAGNOSIS — Z0000 Encounter for general adult medical examination without abnormal findings: Secondary | ICD-10-CM | POA: Diagnosis not present

## 2017-08-10 DIAGNOSIS — Z23 Encounter for immunization: Secondary | ICD-10-CM

## 2017-08-10 DIAGNOSIS — Z1211 Encounter for screening for malignant neoplasm of colon: Secondary | ICD-10-CM

## 2017-08-10 DIAGNOSIS — Z125 Encounter for screening for malignant neoplasm of prostate: Secondary | ICD-10-CM | POA: Diagnosis not present

## 2017-08-10 DIAGNOSIS — Z1159 Encounter for screening for other viral diseases: Secondary | ICD-10-CM

## 2017-08-10 LAB — CBC WITH DIFFERENTIAL/PLATELET
Basophils Absolute: 50 cells/uL (ref 0–200)
Basophils Relative: 1 %
EOS PCT: 2 %
Eosinophils Absolute: 100 cells/uL (ref 15–500)
HEMATOCRIT: 42.3 % (ref 38.5–50.0)
Hemoglobin: 14.2 g/dL (ref 13.2–17.1)
LYMPHS PCT: 25 %
Lymphs Abs: 1250 cells/uL (ref 850–3900)
MCH: 32.7 pg (ref 27.0–33.0)
MCHC: 33.6 g/dL (ref 32.0–36.0)
MCV: 97.5 fL (ref 80.0–100.0)
MONOS PCT: 9 %
MPV: 9.5 fL (ref 7.5–12.5)
Monocytes Absolute: 450 cells/uL (ref 200–950)
NEUTROS PCT: 63 %
Neutro Abs: 3150 cells/uL (ref 1500–7800)
Platelets: 194 10*3/uL (ref 140–400)
RBC: 4.34 MIL/uL (ref 4.20–5.80)
RDW: 13.5 % (ref 11.0–15.0)
WBC: 5 10*3/uL (ref 3.8–10.8)

## 2017-08-10 LAB — COMPLETE METABOLIC PANEL WITH GFR
ALBUMIN: 4.3 g/dL (ref 3.6–5.1)
ALK PHOS: 60 U/L (ref 40–115)
ALT: 22 U/L (ref 9–46)
AST: 31 U/L (ref 10–35)
BILIRUBIN TOTAL: 2.2 mg/dL — AB (ref 0.2–1.2)
BUN: 18 mg/dL (ref 7–25)
CALCIUM: 9.1 mg/dL (ref 8.6–10.3)
CO2: 25 mmol/L (ref 20–32)
CREATININE: 0.75 mg/dL (ref 0.70–1.25)
Chloride: 103 mmol/L (ref 98–110)
Glucose, Bld: 79 mg/dL (ref 65–99)
Potassium: 4.2 mmol/L (ref 3.5–5.3)
Sodium: 138 mmol/L (ref 135–146)
TOTAL PROTEIN: 6.7 g/dL (ref 6.1–8.1)

## 2017-08-10 NOTE — Progress Notes (Signed)
Name: Jeffrey Yates   MRN: [id number]    DOB: 1951-04-16   Date:08/10/2017       Progress Note  Subjective  Chief Complaint  Chief Complaint  Patient presents with  . Annual Exam    CPE    HPI  Pt. Presents for Complete Physical Exam.  He is doing well overall.  Last colonoscopy was completed in 2002, due for repeat screening.  He is due for prostate cancer screening. He is also due for Hepatitis C screening.     Past Medical History:  Diagnosis Date  . Abnormal liver enzymes 06/29/2016  . Alcohol abuse 01/06/15   Siezure from Withdrawl - 11/26/14  . Allergy   . Gout 09/22/2015  . Hyperlipidemia   . Hypertension   . Neuralgia neuritis, sciatic nerve 06/29/2016  . Psoriasis 09/22/2015  . Seizure ([place]) 07/14/2015    Past Surgical History:  Procedure Laterality Date  . CHOLECYSTECTOMY  2007  . FOOT SURGERY Right   . [place] SURGERY  2007  . ROTATOR CUFF REPAIR Left   . VASECTOMY      Family History  Problem Relation Age of Onset  . Alcohol abuse Paternal Grandfather     Social History   Social History  . Marital status: Married    Spouse name: N/A  . Number of children: N/A  . Years of education: N/A   Occupational History  . Not on file.   Social History Main Topics  . Smoking status: Light Tobacco Smoker    Packs/day: 0.50  . Smokeless tobacco: Never Used  . Alcohol use Yes  . Drug use: No  . Sexual activity: No   Other Topics Concern  . Not on file   Social History Narrative  . No narrative on file     Current Outpatient Prescriptions:  .  Adalimumab (HUMIRA) 40 MG/0.8ML PSKT, Inject 40 mg into the skin every 14 (fourteen) days., Disp: , Rfl:  .  atorvastatin (LIPITOR) 20 MG tablet, take 1 tablet by mouth once daily AT 6PM, Disp: 90 tablet, Rfl: 0 .  B Complex Vitamins (VITAMIN B COMPLEX IJ), Take 1 tablet by mouth., Disp: , Rfl:  .  Cholecalciferol (VITAMIN D3) 1000 UNITS CAPS, Take 1 capsule by mouth., Disp: , Rfl:  .  dutasteride  (AVODART) 0.5 MG capsule, Take 0.5 mg by mouth daily. , Disp: , Rfl:  .  famotidine (PEPCID) 40 MG tablet, Take 1 tablet (40 mg total) by mouth every evening., Disp: 30 tablet, Rfl: 1 .  meloxicam (MOBIC) 15 MG tablet, take 1 tablet by mouth once daily with meals, Disp: , Rfl: 0 .  metoprolol succinate (TOPROL-XL) 100 MG 24 hr tablet, take 1 tablet by mouth once daily with meals, Disp: 90 tablet, Rfl: 0 .  naproxen (NAPROSYN) 500 MG tablet, Take 1 tablet (500 mg total) by mouth 2 (two) times daily with a meal., Disp: 30 tablet, Rfl: 0 .  Omega-3 Fatty Acids (FISH OIL PO), Take 1 capsule by mouth daily. , Disp: , Rfl:  .  ranitidine (ZANTAC) 150 MG tablet, Take 1 tablet (150 mg total) by mouth 2 (two) times daily., Disp: 14 tablet, Rfl: 0 .  zolpidem (AMBIEN) 10 MG tablet, Take 0.5 tablets (5 mg total) by mouth at bedtime as needed for sleep., Disp: 30 tablet, Rfl: 1  No Known Allergies   Review of Systems  Constitutional: Negative for chills, fever and malaise/fatigue.  HENT: Negative for congestion, ear pain, sinus pain and  sore throat.   Eyes: Negative for blurred vision and double vision.  Respiratory: Negative for cough and shortness of breath.   Cardiovascular: Negative for chest pain, palpitations and leg swelling.  Gastrointestinal: Negative for abdominal pain, blood in stool, constipation, diarrhea, heartburn, nausea and vomiting.  Genitourinary: Negative for dysuria and hematuria.  Musculoskeletal: Negative for back pain and neck pain.  Neurological: Negative for dizziness and headaches.  Psychiatric/Behavioral: Negative for depression. The patient is not nervous/anxious and does not have insomnia.       Objective  Vitals:   08/10/17 0905  BP: 140/77  Pulse: 86  Resp: 16  Temp: 97.9 F (36.6 C)  TempSrc: Oral  SpO2: 96%  Weight: 180 lb 4.8 oz (81.8 kg)  Height: 5\' 10"  (1.778 m)    Physical Exam  Constitutional: He is oriented to person, place, and time and  well-developed, well-nourished, and in no distress.  HENT:  Head: Normocephalic and atraumatic.  Mouth/Throat: No posterior oropharyngeal erythema.  Bilateral ear canals impacted with cerumen  Eyes: Pupils are equal, round, and reactive to light.  Neck: Neck supple.  Cardiovascular: Normal rate, regular rhythm and normal heart sounds.   No murmur heard. Pulmonary/Chest: Effort normal and breath sounds normal. He has no wheezes.  Abdominal: Soft. Bowel sounds are normal. There is no tenderness.  Genitourinary: Rectum normal and prostate normal.  Musculoskeletal: He exhibits no edema.  Neurological: He is alert and oriented to person, place, and time.  Skin: Skin is warm, dry and intact.  Psychiatric: Mood, memory, affect and judgment normal.  Nursing note and vitals reviewed.     Assessment & Plan  1. Annual physical exam  - CBC with Differential/Platelet - COMPLETE METABOLIC PANEL WITH GFR - TSH - VITAMIN D 25 Hydroxy (Vit-D Deficiency, Fractures)  2. Screening for colon cancer  - Ambulatory referral to Gastroenterology  3. Screening for prostate cancer  - PSA  4. Need for hepatitis C screening test  - Hepatitis C antibody  5. Need for influenza vaccination  - Flu vaccine HIGH DOSE PF (Fluzone High dose)  [name] Asad A. [facility] 08/10/2017 9:19 AM

## 2017-08-11 LAB — TSH: TSH: 1.22 m[IU]/L (ref 0.40–4.50)

## 2017-08-11 LAB — PSA: PSA: 0.6 ng/mL (ref <=4.0)

## 2017-08-11 LAB — VITAMIN D 25 HYDROXY (VIT D DEFICIENCY, FRACTURES): VIT D 25 HYDROXY: 26 ng/mL — AB (ref 30–100)

## 2017-08-11 LAB — HEPATITIS C ANTIBODY: HCV AB: NONREACTIVE

## 2017-08-12 ENCOUNTER — Telehealth: Payer: Self-pay

## 2017-08-12 MED ORDER — VITAMIN D (ERGOCALCIFEROL) 1.25 MG (50000 UNIT) PO CAPS: 50000.0000 [IU] | ORAL_CAPSULE | ORAL | 0 refills | Status: DC

## 2017-08-12 NOTE — Telephone Encounter (Signed)
Patient has been notified of lab results and a prescription for vitamin D3 50,000 units take 1 capsule once a week x12 weeks has been sent to [facility] per Dr. Manuella Ghazi, patient has been notified

## 2017-08-29 ENCOUNTER — Telehealth: Payer: Self-pay | Admitting: Gastroenterology

## 2017-08-29 DIAGNOSIS — Z1212 Encounter for screening for malignant neoplasm of rectum: Principal | ICD-10-CM

## 2017-08-29 DIAGNOSIS — Z1211 Encounter for screening for malignant neoplasm of colon: Secondary | ICD-10-CM

## 2017-08-29 NOTE — Telephone Encounter (Signed)
Patient left a voice message returning your call regarding a referral

## 2017-08-30 ENCOUNTER — Other Ambulatory Visit: Payer: Self-pay | Admitting: Family Medicine

## 2017-08-30 ENCOUNTER — Other Ambulatory Visit: Payer: Self-pay

## 2017-08-30 DIAGNOSIS — G4700 Insomnia, unspecified: Secondary | ICD-10-CM

## 2017-08-30 DIAGNOSIS — E7800 Pure hypercholesterolemia, unspecified: Secondary | ICD-10-CM

## 2017-08-30 DIAGNOSIS — Z1212 Encounter for screening for malignant neoplasm of rectum: Principal | ICD-10-CM

## 2017-08-30 DIAGNOSIS — Z1211 Encounter for screening for malignant neoplasm of colon: Secondary | ICD-10-CM

## 2017-08-30 MED ORDER — NA SULFATE-K SULFATE-MG SULF 17.5-3.13-1.6 GM/177ML PO SOLN: 1.0000 | Freq: Once | ORAL | 0 refills | Status: AC

## 2017-08-30 NOTE — Telephone Encounter (Signed)
Gastroenterology Pre-Procedure Review  Request Date: 9/27 Requesting Physician: Dr. Allen Norris  PATIENT REVIEW QUESTIONS: The patient responded to the following health history questions as indicated:    1. Are you having any GI issues? no 2. Do you have a personal history of Polyps? no 3. Do you have a family history of Colon Cancer or Polyps? no 4. Diabetes Mellitus? no 5. Joint replacements in the past 12 months?no 6. Major health problems in the past 3 months?no 7. Any artificial heart valves, MVP, or defibrillator?no    MEDICATIONS & ALLERGIES:    Patient reports the following regarding taking any anticoagulation/antiplatelet therapy:   Plavix, Coumadin, Eliquis, Xarelto, Lovenox, Pradaxa, Brilinta, or Effient? no Aspirin? yes (81 mg)  Patient confirms/reports the following medications:  Current Outpatient Prescriptions  Medication Sig Dispense Refill  . Adalimumab (HUMIRA) 40 MG/0.8ML PSKT Inject 40 mg into the skin every 14 (fourteen) days.    Marland Kitchen atorvastatin (LIPITOR) 20 MG tablet take 1 tablet by mouth once daily AT 6PM 90 tablet 0  . B Complex Vitamins (VITAMIN B COMPLEX IJ) Take 1 tablet by mouth.    . Cholecalciferol (VITAMIN D3) 1000 UNITS CAPS Take 1 capsule by mouth.    . dutasteride (AVODART) 0.5 MG capsule Take 0.5 mg by mouth daily.     . famotidine (PEPCID) 40 MG tablet Take 1 tablet (40 mg total) by mouth every evening. 30 tablet 1  . meloxicam (MOBIC) 15 MG tablet take 1 tablet by mouth once daily with meals  0  . metoprolol succinate (TOPROL-XL) 100 MG 24 hr tablet take 1 tablet by mouth once daily with meals 90 tablet 0  . naproxen (NAPROSYN) 500 MG tablet Take 1 tablet (500 mg total) by mouth 2 (two) times daily with a meal. 30 tablet 0  . Omega-3 Fatty Acids (FISH OIL PO) Take 1 capsule by mouth daily.     . ranitidine (ZANTAC) 150 MG tablet Take 1 tablet (150 mg total) by mouth 2 (two) times daily. 14 tablet 0  . Vitamin D, Ergocalciferol, (DRISDOL) 50000 units  CAPS capsule Take 1 capsule (50,000 Units total) by mouth once a week. For 12 weeks 12 capsule 0  . zolpidem (AMBIEN) 10 MG tablet Take 0.5 tablets (5 mg total) by mouth at bedtime as needed for sleep. 30 tablet 1   No current facility-administered medications for this visit.     Patient confirms/reports the following allergies:  No Known Allergies  No orders of the defined types were placed in this encounter.   AUTHORIZATION INFORMATION Primary Insurance: 1D#: Group #:  Secondary Insurance: 1D#: Group #:  SCHEDULE INFORMATION: Date: 9/27 Time: Location: [place]

## 2017-09-05 ENCOUNTER — Telehealth: Payer: Self-pay | Admitting: Gastroenterology

## 2017-09-05 ENCOUNTER — Other Ambulatory Visit: Payer: Self-pay

## 2017-09-05 ENCOUNTER — Encounter: Payer: Self-pay | Admitting: *Deleted

## 2017-09-05 MED ORDER — NA SULFATE-K SULFATE-MG SULF 17.5-3.13-1.6 GM/177ML PO SOLN: 1.0000 | ORAL | 0 refills | Status: DC

## 2017-09-05 NOTE — Telephone Encounter (Signed)
Pt notified bowel prep has been sent to his pharmacy. Will call me back with any questions.

## 2017-09-05 NOTE — Telephone Encounter (Signed)
Patient needs his prep called into [facility] on [address] ASAP. Please call him to let him know you did.

## 2017-09-07 NOTE — Discharge Instructions (Signed)
General Anesthesia, Adult, Care After °These instructions provide you with information about caring for yourself after your procedure. Your health care provider may also give you more specific instructions. Your treatment has been planned according to current medical practices, but problems sometimes occur. Call your health care provider if you have any problems or questions after your procedure. °What can I expect after the procedure? °After the procedure, it is common to have: °· Vomiting. °· A sore throat. °· Mental slowness. ° °It is common to feel: °· Nauseous. °· Cold or shivery. °· Sleepy. °· Tired. °· Sore or achy, even in parts of your body where you did not have surgery. ° °Follow these instructions at home: °For at least 24 hours after the procedure: °· Do not: °? Participate in activities where you could fall or become injured. °? Drive. °? Use heavy machinery. °? Drink alcohol. °? Take sleeping pills or medicines that cause drowsiness. °? Make important decisions or sign legal documents. °? Take care of children on your own. °· Rest. °Eating and drinking °· If you vomit, drink water, juice, or soup when you can drink without vomiting. °· Drink enough fluid to keep your urine clear or pale yellow. °· Make sure you have little or no nausea before eating solid foods. °· Follow the diet recommended by your health care provider. °General instructions °· Have a responsible adult stay with you until you are awake and alert. °· Return to your normal activities as told by your health care provider. Ask your health care provider what activities are safe for you. °· Take over-the-counter and prescription medicines only as told by your health care provider. °· If you smoke, do not smoke without supervision. °· Keep all follow-up visits as told by your health care provider. This is important. °Contact a health care provider if: °· You continue to have nausea or vomiting at home, and medicines are not helpful. °· You  cannot drink fluids or start eating again. °· You cannot urinate after 8-12 hours. °· You develop a skin rash. °· You have fever. °· You have increasing redness at the site of your procedure. °Get help right away if: °· You have difficulty breathing. °· You have chest pain. °· You have unexpected bleeding. °· You feel that you are having a life-threatening or urgent problem. °This information is not intended to replace advice given to you by your health care provider. Make sure you discuss any questions you have with your health care provider. °Document Released: 03/07/2001 Document Revised: 05/03/2016 Document Reviewed: 11/13/2015 °Elsevier Interactive Patient Education © 2018 Else[employer] Inc. ° °

## 2017-09-08 ENCOUNTER — Encounter
Admission: RE | Disposition: A | Discharge status: Home / Self Care | Payer: Self-pay | Source: Ambulatory Visit | Attending: Gastroenterology

## 2017-09-08 ENCOUNTER — Ambulatory Visit
Admission: RE | Admit: 2017-09-08 | Discharge: 2017-09-08 | Disposition: A | Discharge status: Home / Self Care | Payer: 59 | Source: Ambulatory Visit | Attending: Gastroenterology | Admitting: Gastroenterology

## 2017-09-08 ENCOUNTER — Ambulatory Visit: Payer: 59 | Admitting: Anesthesiology

## 2017-09-08 DIAGNOSIS — M5430 Sciatica, unspecified side: Secondary | ICD-10-CM | POA: Insufficient documentation

## 2017-09-08 DIAGNOSIS — D124 Benign neoplasm of descending colon: Secondary | ICD-10-CM | POA: Diagnosis not present

## 2017-09-08 DIAGNOSIS — I10 Essential (primary) hypertension: Secondary | ICD-10-CM | POA: Diagnosis not present

## 2017-09-08 DIAGNOSIS — L409 Psoriasis, unspecified: Secondary | ICD-10-CM | POA: Insufficient documentation

## 2017-09-08 DIAGNOSIS — Z79899 Other long term (current) drug therapy: Secondary | ICD-10-CM | POA: Insufficient documentation

## 2017-09-08 DIAGNOSIS — Z1211 Encounter for screening for malignant neoplasm of colon: Secondary | ICD-10-CM | POA: Diagnosis present

## 2017-09-08 DIAGNOSIS — M109 Gout, unspecified: Secondary | ICD-10-CM | POA: Diagnosis not present

## 2017-09-08 DIAGNOSIS — E785 Hyperlipidemia, unspecified: Secondary | ICD-10-CM | POA: Diagnosis not present

## 2017-09-08 DIAGNOSIS — Z1212 Encounter for screening for malignant neoplasm of rectum: Secondary | ICD-10-CM

## 2017-09-08 DIAGNOSIS — F17210 Nicotine dependence, cigarettes, uncomplicated: Secondary | ICD-10-CM | POA: Insufficient documentation

## 2017-09-08 HISTORY — PX: COLONOSCOPY WITH PROPOFOL: SHX5780

## 2017-09-08 HISTORY — PX: POLYPECTOMY: SHX5525

## 2017-09-08 SURGERY — COLONOSCOPY WITH PROPOFOL
Anesthesia: General

## 2017-09-08 MED ORDER — STERILE WATER FOR IRRIGATION IR SOLN
Status: DC | PRN
  Administered 2017-09-08: 09:00:00

## 2017-09-08 MED ORDER — OXYCODONE HCL 5 MG PO TABS: 5.0000 mg | ORAL_TABLET | Freq: Once | ORAL | Status: DC | PRN

## 2017-09-08 MED ORDER — SODIUM CHLORIDE 0.9 % IV SOLN: INTRAVENOUS | Status: DC

## 2017-09-08 MED ORDER — LACTATED RINGERS IV SOLN
INTRAVENOUS | Status: DC
  Administered 2017-09-08: 07:00:00 via INTRAVENOUS

## 2017-09-08 MED ORDER — PROPOFOL 10 MG/ML IV BOLUS
INTRAVENOUS | Status: DC | PRN
Start: 2017-09-08 — End: 2017-09-08
  Administered 2017-09-08 (×3): 50 mg via INTRAVENOUS
  Administered 2017-09-08: 100 mg via INTRAVENOUS
  Administered 2017-09-08 (×2): 50 mg via INTRAVENOUS

## 2017-09-08 MED ORDER — OXYCODONE HCL 5 MG/5ML PO SOLN: 5.0000 mg | Freq: Once | ORAL | Status: DC | PRN

## 2017-09-08 MED ORDER — LIDOCAINE HCL (CARDIAC) 20 MG/ML IV SOLN
INTRAVENOUS | Status: DC | PRN
  Administered 2017-09-08: 40 mg via INTRAVENOUS

## 2017-09-08 MED ORDER — LACTATED RINGERS IV SOLN
INTRAVENOUS | Status: DC
Start: 2017-09-08 — End: 2017-09-08
  Administered 2017-09-08: 09:00:00 via INTRAVENOUS

## 2017-09-08 SURGICAL SUPPLY — 23 items

## 2017-09-08 NOTE — Op Note (Signed)
[facility] Gastroenterology Patient Name: Jeffrey Yates Procedure Date: 09/08/2017 8:43 AM MRN: [id number] Account #: [id number] Date of Birth: 1951/05/16 Admit Type: Outpatient Age: 66 Room: [facility] OR ROOM 01 Gender: Male Note Status: Finalized Procedure:            Colonoscopy Indications:          Screening for colorectal malignant neoplasm Providers:            Lucilla Lame MD, MD Referring MD:         Otila Back. Manuella Ghazi (Referring MD) Medicines:            Propofol per Anesthesia Complications:        No immediate complications. Procedure:            Pre-Anesthesia Assessment:                       - Prior to the procedure, a History and Physical was                        performed, and patient medications and allergies were                        reviewed. The patient's tolerance of previous                        anesthesia was also reviewed. The risks and benefits of                        the procedure and the sedation options and risks were                        discussed with the patient. All questions were                        answered, and informed consent was obtained. Prior                        Anticoagulants: The patient has taken no previous                        anticoagulant or antiplatelet agents. ASA Grade                        Assessment: II - A patient with mild systemic disease.                        After reviewing the risks and benefits, the patient was                        deemed in satisfactory condition to undergo the                        procedure.                       After obtaining informed consent, the colonoscope was                        passed under direct vision. Throughout the procedure,  the patient's blood pressure, pulse, and oxygen                        saturations were monitored continuously. The Olympus                        Colonoscope [id number]) was introduced through the              anus and advanced to the the cecum, identified by                        appendiceal orifice and ileocecal valve. The                        colonoscopy was performed without difficulty. The                        patient tolerated the procedure well. The quality of                        the bowel preparation was excellent. Findings:      The perianal and digital rectal examinations were normal.      A 3 mm polyp was found in the descending colon. The polyp was sessile.       The polyp was removed with a cold biopsy forceps. Resection and       retrieval were complete.      There was a medium-sized lipoma, at the anus. Biopsies were taken with a       cold forceps for histology. Impression:           - One 3 mm polyp in the descending colon, removed with                        a cold biopsy forceps. Resected and retrieved.                       - Medium-sized lipoma at the anus. Biopsied. Recommendation:       - Discharge patient to home.                       - Resume previous diet.                       - Continue present medications.                       - Await pathology results.                       - Repeat colonoscopy in 5 years if polyp adenoma and 10                        years if hyperplastic Procedure Code(s):    --- Professional ---                       [id number], Colonoscopy, flexible; with biopsy, single or                        multiple Diagnosis Code(s):    --- Professional ---  Z12.11, Encounter for screening for malignant neoplasm                        of colon                       D12.4, Benign neoplasm of descending colon CPT copyright 2016 American [employer] Association. All rights reserved. The codes documented in this report are preliminary and upon coder review may  be revised to meet current compliance requirements. Lucilla Lame MD, MD 09/08/2017 9:05:39 AM This report has been signed electronically. Number of Addenda: 0 Note  Initiated On: 09/08/2017 8:43 AM Scope Withdrawal Time: 0 hours 9 minutes 1 second  Total Procedure Duration: 0 hours 12 minutes 17 seconds       [facility]

## 2017-09-08 NOTE — Anesthesia Preprocedure Evaluation (Signed)
Anesthesia Evaluation  Patient identified by MRN, date of birth, ID band Patient awake    Reviewed: Allergy & Precautions, NPO status , Patient's Chart, lab work & pertinent test results, reviewed documented beta blocker date and time   History of Anesthesia Complications Negative for: history of anesthetic complications  Airway Mallampati: II  TM Distance: >3 FB Neck ROM: full    Dental no notable dental hx.    Pulmonary Current Smoker,    Pulmonary exam normal        Cardiovascular hypertension, Pt. on medications negative cardio ROS Normal cardiovascular exam     Neuro/Psych PSYCHIATRIC DISORDERS    GI/Hepatic negative GI ROS, Neg liver ROS,   Endo/Other    Renal/GU negative Renal ROS  negative genitourinary   Musculoskeletal   Abdominal   Peds negative pediatric ROS (+)  Hematology negative hematology ROS (+)   Anesthesia Other Findings   Reproductive/Obstetrics                             Anesthesia Physical Anesthesia Plan  ASA: II  Anesthesia Plan: General   Post-op Pain Management:    Induction:   PONV Risk Score and Plan:   Airway Management Planned:   Additional Equipment:   Intra-op Plan:   Post-operative Plan:   Informed Consent: I have reviewed the patients History and Physical, chart, labs and discussed the procedure including the risks, benefits and alternatives for the proposed anesthesia with the patient or authorized representative who has indicated his/her understanding and acceptance.     Plan Discussed with:   Anesthesia Plan Comments:         Anesthesia Quick Evaluation

## 2017-09-08 NOTE — H&P (Signed)
Lucilla Lame, MD [facility] [address] [place],  [address] Phone: [phone] Fax : [phone]  Primary Care Physician:  Roselee Nova, MD Primary Gastroenterologist:  Dr. Allen Norris  Pre-Procedure History & Physical: HPI:  Jeffrey Yates is a 66 y.o. male is here for a screening colonoscopy.   Past Medical History:  Diagnosis Date  . Abnormal liver enzymes 06/29/2016  . Alcohol abuse 01/06/15   Siezure from Withdrawl - 11/26/14  . Allergy   . Gout 09/22/2015  . Hyperlipidemia   . Hypertension   . Neuralgia neuritis, sciatic nerve 06/29/2016  . Psoriasis 09/22/2015  . Seizure ([place]) 07/14/2015    Past Surgical History:  Procedure Laterality Date  . CHOLECYSTECTOMY  2007  . FOOT SURGERY Right   . [place] SURGERY  2007  . ROTATOR CUFF REPAIR Left   . VASECTOMY      Prior to Admission medications   Medication Sig Start Date End Date Taking? Authorizing Provider  Adalimumab (HUMIRA) 40 MG/0.8ML PSKT Inject 40 mg into the skin every 14 (fourteen) days.   Yes [provider]  atorvastatin (LIPITOR) 20 MG tablet take 1 tablet by mouth once daily AT 6PM 08/30/17  Yes Roselee Nova, MD  B Complex Vitamins (VITAMIN B COMPLEX IJ) Take 1 tablet by mouth.   Yes [provider]  Cholecalciferol (VITAMIN D3) 1000 UNITS CAPS Take 1 capsule by mouth.   Yes [provider]  dutasteride (AVODART) 0.5 MG capsule Take 0.5 mg by mouth daily.    Yes [provider]  meloxicam (MOBIC) 15 MG tablet take 1 tablet by mouth once daily with meals 05/31/16  Yes [provider]  metoprolol succinate (TOPROL-XL) 100 MG 24 hr tablet take 1 tablet by mouth once daily with meals 06/24/17  Yes Keith Rake Asad A, MD  Na Sulfate-K Sulfate-Mg Sulf (SUPREP BOWEL PREP KIT) 17.5-3.13-1.6 GM/180ML SOLN Take 1 kit by mouth as directed. 09/05/17  Yes Lucilla Lame, MD  naproxen (NAPROSYN) 500 MG tablet Take 1 tablet (500 mg total) by mouth 2 (two) times daily  with a meal. 07/04/17 07/04/18 Yes Laban Emperor, PA-C  Omega-3 Fatty Acids (FISH OIL PO) Take 1 capsule by mouth daily.    Yes [provider]  Vitamin D, Ergocalciferol, (DRISDOL) 50000 units CAPS capsule Take 1 capsule (50,000 Units total) by mouth once a week. For 12 weeks 08/12/17  Yes Roselee Nova, MD  zolpidem (AMBIEN) 10 MG tablet take 1/2 tablet by mouth at bedtime if needed for sleep 08/30/17  Yes Roselee Nova, MD  famotidine (PEPCID) 40 MG tablet Take 1 tablet (40 mg total) by mouth every evening. Patient not taking: Reported on 09/05/2017 06/04/17 06/04/18  Schuyler Amor, MD  ranitidine (ZANTAC) 150 MG tablet Take 1 tablet (150 mg total) by mouth 2 (two) times daily. Patient not taking: Reported on 09/05/2017 07/04/17 07/04/18  Laban Emperor, PA-C    Allergies as of 08/30/2017  . (No Known Allergies)    Family History  Problem Relation Age of Onset  . Alcohol abuse Paternal Grandfather     Social History   Social History  . Marital status: Married    Spouse name: N/A  . Number of children: N/A  . Years of education: N/A   Occupational History  . Not on file.   Social History Main Topics  . Smoking status: Light Tobacco Smoker    Packs/day: 0.25  . Smokeless tobacco: Never  Used     Comment: 1 PPWeek, Smoked 10 yrs, quit for 17, restarted 1.5 yrs ago  . Alcohol use Yes  . Drug use: No  . Sexual activity: No   Other Topics Concern  . Not on file   Social History Narrative  . No narrative on file    Review of Systems: See HPI, otherwise negative ROS  Physical Exam: BP (!) 147/98   Pulse 73   Temp 98.1 F (36.7 C) (Temporal)   Resp 16   Ht 5' 10"  (1.778 m)   Wt 177 lb (80.3 kg)   SpO2 97%   BMI 25.40 kg/m  General:   Alert,  pleasant and cooperative in NAD Head:  Normocephalic and atraumatic. Neck:  Supple; no masses or thyromegaly. Lungs:  Clear throughout to auscultation.    Heart:  Regular rate and rhythm. Abdomen:  Soft,  nontender and nondistended. Normal bowel sounds, without guarding, and without rebound.   Neurologic:  Alert and  oriented x4;  grossly normal neurologically.  Impression/Plan: Jeffrey Yates is now here to undergo a screening colonoscopy.  Risks, benefits, and alternatives regarding colonoscopy have been reviewed with the patient.  Questions have been answered.  All parties agreeable.

## 2017-09-08 NOTE — Anesthesia Procedure Notes (Signed)
Procedure Name: MAC Date/Time: 09/08/2017 8:46 AM Performed by: Janna Arch Pre-anesthesia Checklist: Patient identified, Emergency Drugs available, Suction available and Patient being monitored Patient Re-evaluated:Patient Re-evaluated prior to induction Oxygen Delivery Method: Nasal cannula

## 2017-09-08 NOTE — Transfer of Care (Signed)
Immediate Anesthesia Transfer of Care Note  Patient: Jeffrey Yates  Procedure(s) Performed: Procedure(s): COLONOSCOPY WITH PROPOFOL (N/A) POLYPECTOMY (N/A)  Patient Location: PACU  Anesthesia Type: General  Level of Consciousness: awake, alert  and patient cooperative  Airway and Oxygen Therapy: Patient Spontanous Breathing and Patient connected to supplemental oxygen  Post-op Assessment: Post-op Vital signs reviewed, Patient's Cardiovascular Status Stable, Respiratory Function Stable, Patent Airway and No signs of Nausea or vomiting  Post-op Vital Signs: Reviewed and stable  Complications: No apparent anesthesia complications

## 2017-09-08 NOTE — Anesthesia Postprocedure Evaluation (Signed)
Anesthesia Post Note  Patient: Jeffrey Yates  Procedure(s) Performed: Procedure(s) (LRB): COLONOSCOPY WITH PROPOFOL (N/A) POLYPECTOMY (N/A)  Patient location during evaluation: PACU Anesthesia Type: General Level of consciousness: awake and alert Pain management: pain level controlled Vital Signs Assessment: post-procedure vital signs reviewed and stable Respiratory status: spontaneous breathing Cardiovascular status: blood pressure returned to baseline Postop Assessment: no headache Anesthetic complications: no    Jaci Standard, III,  Zaccheaus Storlie D

## 2017-09-09 ENCOUNTER — Encounter: Payer: Self-pay | Admitting: Gastroenterology

## 2017-09-13 ENCOUNTER — Encounter: Payer: Self-pay | Admitting: Gastroenterology

## 2017-09-21 ENCOUNTER — Telehealth: Payer: Self-pay

## 2017-09-21 NOTE — Telephone Encounter (Signed)
Called pt per Dr.Shah and advised pt of the need to change ambien to a different medication. Scheduled pt for a med f/u to discuss.

## 2017-10-10 ENCOUNTER — Other Ambulatory Visit: Payer: Self-pay | Admitting: Family Medicine

## 2017-10-10 DIAGNOSIS — E7800 Pure hypercholesterolemia, unspecified: Secondary | ICD-10-CM

## 2017-10-24 ENCOUNTER — Encounter: Payer: Self-pay | Admitting: Family Medicine

## 2017-10-24 ENCOUNTER — Ambulatory Visit: Payer: PRIVATE HEALTH INSURANCE | Admitting: Family Medicine

## 2017-10-24 VITALS — BP 116/68 | Temp 98.5°F | Resp 16 | Ht 70.0 in | Wt 187.6 lb

## 2017-10-24 DIAGNOSIS — G4700 Insomnia, unspecified: Secondary | ICD-10-CM | POA: Diagnosis not present

## 2017-10-24 MED ORDER — TRAZODONE HCL 50 MG PO TABS: 25.0000 mg | ORAL_TABLET | Freq: Every evening | ORAL | 2 refills | Status: DC | PRN

## 2017-10-24 NOTE — Progress Notes (Signed)
Name: Jeffrey Yates   MRN: [id number]    DOB: 26-Jan-1951   Date:10/24/2017       Progress Note  Subjective  Chief Complaint  Chief Complaint  Patient presents with  . Medication Management    patient was told via phone conversation about another medication to try other than Ambien    HPI  Pt. is here to switch medication from Ambien to a different sleep aid, he wakes up during the night after sleeping for a few hours and cannot go back to sleep. That's when he takes Ambien and he was advised to changed to a different, safer agent. Normally, he goes to bed at 9 PM and wakes up at 5:30 AM   Past Medical History:  Diagnosis Date  . Abnormal liver enzymes 06/29/2016  . Alcohol abuse 01/06/15   Siezure from Withdrawl - 11/26/14  . Allergy   . Gout 09/22/2015  . Hyperlipidemia   . Hypertension   . Neuralgia neuritis, sciatic nerve 06/29/2016  . Psoriasis 09/22/2015  . Seizure ([place]) 07/14/2015    Past Surgical History:  Procedure Laterality Date  . CHOLECYSTECTOMY  2007  . FOOT SURGERY Right   . [place] SURGERY  2007  . ROTATOR CUFF REPAIR Left   . VASECTOMY      Family History  Problem Relation Age of Onset  . Alcohol abuse Paternal Grandfather     Social History   Socioeconomic History  . Marital status: Married    Spouse name: Not on file  . Number of children: Not on file  . Years of education: Not on file  . Highest education level: Not on file  Social Needs  . Financial resource strain: Not on file  . Food insecurity - worry: Not on file  . Food insecurity - inability: Not on file  . Transportation needs - medical: Not on file  . Transportation needs - non-medical: Not on file  Occupational History  . Not on file  Tobacco Use  . Smoking status: Light Tobacco Smoker    Packs/day: 0.25  . Smokeless tobacco: Never Used  . Tobacco comment: 1 PPWeek, Smoked 10 yrs, quit for 17, restarted 1.5 yrs ago  Substance and Sexual Activity  . Alcohol use: Yes  .  Drug use: No  . Sexual activity: No  Other Topics Concern  . Not on file  Social History Narrative  . Not on file     Current Outpatient Medications:  .  Adalimumab (HUMIRA) 40 MG/0.8ML PSKT, Inject 40 mg into the skin every 14 (fourteen) days., Disp: , Rfl:  .  atorvastatin (LIPITOR) 20 MG tablet, take 1 tablet by mouth once daily AT 6PM, Disp: 30 tablet, Rfl: 0 .  B Complex Vitamins (VITAMIN B COMPLEX IJ), Take 1 tablet by mouth., Disp: , Rfl:  .  Cholecalciferol (VITAMIN D3) 1000 UNITS CAPS, Take 1 capsule by mouth., Disp: , Rfl:  .  dutasteride (AVODART) 0.5 MG capsule, Take 0.5 mg by mouth daily. , Disp: , Rfl:  .  meloxicam (MOBIC) 15 MG tablet, take 1 tablet by mouth once daily with meals, Disp: , Rfl: 0 .  metoprolol succinate (TOPROL-XL) 100 MG 24 hr tablet, take 1 tablet by mouth once daily with meals, Disp: 90 tablet, Rfl: 0 .  naproxen (NAPROSYN) 500 MG tablet, Take 1 tablet (500 mg total) by mouth 2 (two) times daily with a meal., Disp: 30 tablet, Rfl: 0 .  Omega-3 Fatty Acids (FISH OIL PO), Take 1  capsule by mouth daily. , Disp: , Rfl:  .  zolpidem (AMBIEN) 10 MG tablet, take 1/2 tablet by mouth at bedtime if needed for sleep, Disp: 30 tablet, Rfl: 0 .  famotidine (PEPCID) 40 MG tablet, Take 1 tablet (40 mg total) by mouth every evening. (Patient not taking: Reported on 09/05/2017), Disp: 30 tablet, Rfl: 1  No Known Allergies   ROS  Please see history of present illness for complete discussion of ROS  Objective  Vitals:   10/24/17 0903  BP: 116/68  Resp: 16  Temp: 98.5 F (36.9 C)  TempSrc: Oral  Weight: 187 lb 9.6 oz (85.1 kg)  Height: 5' 10"  (1.778 m)    Physical Exam  Constitutional: He is oriented to person, place, and time and well-developed, well-nourished, and in no distress.  HENT:  Head: Normocephalic and atraumatic.  Cardiovascular: Normal rate, regular rhythm and normal heart sounds.  No murmur heard. Pulmonary/Chest: Effort normal and breath  sounds normal. He has no wheezes.  Neurological: He is alert and oriented to person, place, and time.  Psychiatric: Mood, memory, affect and judgment normal.  Nursing note and vitals reviewed.   Recent Results (from the past 2160 hour(s))  CBC with Differential/Platelet     Status: None   Collection Time: 08/10/17 10:07 AM  Result Value Ref Range   WBC 5.0 3.8 - 10.8 K/uL   RBC 4.34 4.20 - 5.80 MIL/uL   Hemoglobin 14.2 13.2 - 17.1 g/dL   HCT 42.3 38.5 - 50.0 %   MCV 97.5 80.0 - 100.0 fL   MCH 32.7 27.0 - 33.0 pg   MCHC 33.6 32.0 - 36.0 g/dL   RDW 13.5 11.0 - 15.0 %   Platelets 194 140 - 400 K/uL   MPV 9.5 7.5 - 12.5 fL   Neutro Abs 3,150 1,500 - 7,800 cells/uL   Lymphs Abs 1,250 850 - 3,900 cells/uL   Monocytes Absolute 450 200 - 950 cells/uL   Eosinophils Absolute 100 15 - 500 cells/uL   Basophils Absolute 50 0 - 200 cells/uL   Neutrophils Relative % 63 %   Lymphocytes Relative 25 %   Monocytes Relative 9 %   Eosinophils Relative 2 %   Basophils Relative 1 %   Smear Review Criteria for review not met   COMPLETE METABOLIC PANEL WITH GFR     Status: Abnormal   Collection Time: 08/10/17 10:07 AM  Result Value Ref Range   Sodium 138 135 - 146 mmol/L   Potassium 4.2 3.5 - 5.3 mmol/L   Chloride 103 98 - 110 mmol/L   CO2 25 20 - 32 mmol/L    Comment: ** Please note change in reference range(s). **      Glucose, Bld 79 65 - 99 mg/dL   BUN 18 7 - 25 mg/dL   Creat 0.75 0.70 - 1.25 mg/dL    Comment:   For patients > or = 66 years of age: The upper reference limit for Creatinine is approximately 13% higher for people identified as African-American.      Total Bilirubin 2.2 (H) 0.2 - 1.2 mg/dL   Alkaline Phosphatase 60 40 - 115 U/L   AST 31 10 - 35 U/L   ALT 22 9 - 46 U/L   Total Protein 6.7 6.1 - 8.1 g/dL   Albumin 4.3 3.6 - 5.1 g/dL   Calcium 9.1 8.6 - 10.3 mg/dL   GFR, Est African American >89 >=60 mL/min   GFR, Est Non African American >89 >=  60 mL/min  TSH      Status: None   Collection Time: 08/10/17 10:07 AM  Result Value Ref Range   TSH 1.22 0.40 - 4.50 mIU/L  VITAMIN D 25 Hydroxy (Vit-D Deficiency, Fractures)     Status: Abnormal   Collection Time: 08/10/17 10:07 AM  Result Value Ref Range   Vit D, 25-Hydroxy 26 (L) 30 - 100 ng/mL    Comment: Vitamin D Status           25-OH Vitamin D        Deficiency                <20 ng/mL        Insufficiency         20 - 29 ng/mL        Optimal             > or = 30 ng/mL   For 25-OH Vitamin D testing on patients on D2-supplementation and patients for whom quantitation of D2 and D3 fractions is required, the QuestAssureD 25-OH VIT D, (D2,D3), LC/MS/MS is recommended: order code 863 536 [id number] (patients > 2 yrs).   Hepatitis C antibody     Status: None   Collection Time: 08/10/17 10:07 AM  Result Value Ref Range   HCV Ab NON-REACTIVE NON-REACTIVE    Comment:                                                                        This test is for screening purposes only.  Reactive results should be confirmed by an alternative method.  Suggest HCV Qualitative, PCR, test code 83130.  Specimens will be stable for reflex testing up to 3 days after collection.   PSA     Status: None   Collection Time: 08/10/17 10:07 AM  Result Value Ref Range   PSA 0.6 <=4.0 ng/mL    Comment:   The total PSA value from this assay system is standardized against the WHO standard. The test result will be approximately 20% lower when compared to the equimolar-standardized total PSA ([employer] Coul[employer]). Comparison of serial PSA results should be interpreted with this fact in mind.   This test was performed using the Siemens chemiluminescent method. Values obtained from different assay methods cannot be used interchangeably. PSA levels, regardless of value, should not be interpreted as absolute evidence of the presence or absence of disease.        Assessment & Plan  1. Insomnia, unspecified type DC Ambien and change to  trazodone, advised take it every night as needed, recheck in 3 months - traZODone (DESYREL) 50 MG tablet; Take 0.5-1 tablets (25-50 mg total) at bedtime as needed by mouth for sleep.  Dispense: 30 tablet; Refill: 2   Maribel Luis Asad A. [facility] 10/24/2017 9:12 AM

## 2017-10-28 ENCOUNTER — Other Ambulatory Visit: Payer: Self-pay | Admitting: Family Medicine

## 2017-10-28 DIAGNOSIS — I10 Essential (primary) hypertension: Secondary | ICD-10-CM

## 2017-11-09 ENCOUNTER — Other Ambulatory Visit: Payer: Self-pay | Admitting: Family Medicine

## 2017-11-14 ENCOUNTER — Ambulatory Visit: Payer: 59 | Admitting: Family Medicine

## 2017-11-22 ENCOUNTER — Other Ambulatory Visit: Payer: Self-pay | Admitting: Family Medicine

## 2017-11-22 DIAGNOSIS — E7800 Pure hypercholesterolemia, unspecified: Secondary | ICD-10-CM

## 2017-12-13 HISTORY — PX: COLONOSCOPY W/ POLYPECTOMY: SHX1380

## 2017-12-25 ENCOUNTER — Other Ambulatory Visit: Payer: Self-pay | Admitting: Family Medicine

## 2017-12-25 DIAGNOSIS — E7800 Pure hypercholesterolemia, unspecified: Secondary | ICD-10-CM

## 2017-12-27 ENCOUNTER — Other Ambulatory Visit: Payer: Self-pay | Admitting: Family Medicine

## 2017-12-27 DIAGNOSIS — I10 Essential (primary) hypertension: Secondary | ICD-10-CM

## 2018-01-21 ENCOUNTER — Other Ambulatory Visit: Payer: Self-pay | Admitting: Family Medicine

## 2018-01-21 DIAGNOSIS — G4700 Insomnia, unspecified: Secondary | ICD-10-CM

## 2018-01-24 ENCOUNTER — Ambulatory Visit: Payer: PRIVATE HEALTH INSURANCE | Admitting: Family Medicine

## 2018-01-25 ENCOUNTER — Ambulatory Visit: Payer: PRIVATE HEALTH INSURANCE | Admitting: Family Medicine

## 2018-01-25 ENCOUNTER — Encounter: Payer: Self-pay | Admitting: Family Medicine

## 2018-01-25 VITALS — BP 134/74 | HR 83 | Temp 98.3°F | Ht 70.0 in | Wt 187.5 lb

## 2018-01-25 DIAGNOSIS — E7800 Pure hypercholesterolemia, unspecified: Secondary | ICD-10-CM | POA: Diagnosis not present

## 2018-01-25 DIAGNOSIS — I10 Essential (primary) hypertension: Secondary | ICD-10-CM

## 2018-01-25 DIAGNOSIS — G4700 Insomnia, unspecified: Secondary | ICD-10-CM

## 2018-01-25 MED ORDER — METOPROLOL SUCCINATE ER 100 MG PO TB24: ORAL_TABLET | ORAL | 0 refills | Status: DC

## 2018-01-25 MED ORDER — ATORVASTATIN CALCIUM 20 MG PO TABS: ORAL_TABLET | ORAL | 0 refills | Status: DC

## 2018-01-25 NOTE — Progress Notes (Signed)
Name: Jeffrey Yates   MRN: [id number]    DOB: Aug 13, 1951   Date:01/25/2018       Progress Note  Subjective  Chief Complaint  Chief Complaint  Patient presents with  . Follow-up    recent colonoscopy, cancerous polyp    Hypertension  This is a chronic problem. The problem is unchanged. The problem is controlled. Pertinent negatives include no blurred vision, chest pain, headaches, palpitations or shortness of breath. Past treatments include beta blockers. There is no history of kidney disease, CAD/MI or CVA.  Hyperlipidemia  This is a chronic problem. The problem is controlled. Recent lipid tests were reviewed and are normal. Pertinent negatives include no chest pain, leg pain, myalgias or shortness of breath. Current antihyperlipidemic treatment includes statins.  Insomnia  Primary symptoms: premature morning awakening.  The onset quality is gradual. The problem occurs intermittently. The symptoms are aggravated by anxiety. Past treatments include medication.      Past Medical History:  Diagnosis Date  . Abnormal liver enzymes 06/29/2016  . Alcohol abuse 01/06/15   Siezure from Withdrawl - 11/26/14  . Allergy   . Gout 09/22/2015  . Hyperlipidemia   . Hypertension   . Neuralgia neuritis, sciatic nerve 06/29/2016  . Psoriasis 09/22/2015  . Seizure ([place]) 07/14/2015    Past Surgical History:  Procedure Laterality Date  . CHOLECYSTECTOMY  2007  . COLONOSCOPY W/ POLYPECTOMY  2019   cancerous polyp  . COLONOSCOPY WITH PROPOFOL N/A 09/08/2017   Procedure: COLONOSCOPY WITH PROPOFOL;  Surgeon: Lucilla Lame, MD;  Location: [facility];  Service: Gastroenterology;  Laterality: N/A;  . FOOT SURGERY Right   . [place] SURGERY  2007  . POLYPECTOMY N/A 09/08/2017   Procedure: POLYPECTOMY;  Surgeon: Lucilla Lame, MD;  Location: [facility];  Service: Gastroenterology;  Laterality: N/A;  . ROTATOR CUFF REPAIR Left   . VASECTOMY      Family History  Problem Relation Age  of Onset  . Alcohol abuse Paternal Grandfather     Social History   Socioeconomic History  . Marital status: Married    Spouse name: Not on file  . Number of children: Not on file  . Years of education: Not on file  . Highest education level: Not on file  Social Needs  . Financial resource strain: Not on file  . Food insecurity - worry: Not on file  . Food insecurity - inability: Not on file  . Transportation needs - medical: Not on file  . Transportation needs - non-medical: Not on file  Occupational History  . Not on file  Tobacco Use  . Smoking status: Light Tobacco Smoker    Packs/day: 0.25  . Smokeless tobacco: Never Used  . Tobacco comment: 1 PPWeek, Smoked 10 yrs, quit for 17, restarted 1.5 yrs ago  Substance and Sexual Activity  . Alcohol use: Yes  . Drug use: No  . Sexual activity: No  Other Topics Concern  . Not on file  Social History Narrative  . Not on file     Current Outpatient Medications:  .  Adalimumab (HUMIRA) 40 MG/0.8ML PSKT, Inject 40 mg into the skin every 14 (fourteen) days., Disp: , Rfl:  .  atorvastatin (LIPITOR) 20 MG tablet, take 1 tablet by mouth once daily AT 6 PM, Disp: 30 tablet, Rfl: 0 .  B Complex Vitamins (VITAMIN B COMPLEX IJ), Take 1 tablet by mouth., Disp: , Rfl:  .  Cholecalciferol (VITAMIN D3) 1000 UNITS CAPS, Take 1 capsule  by mouth., Disp: , Rfl:  .  dutasteride (AVODART) 0.5 MG capsule, Take 0.5 mg by mouth daily. , Disp: , Rfl:  .  meloxicam (MOBIC) 15 MG tablet, take 1 tablet by mouth once daily with meals, Disp: , Rfl: 0 .  metoprolol succinate (TOPROL-XL) 100 MG 24 hr tablet, take 1 tablet by mouth once daily with food, Disp: 60 tablet, Rfl: 0 .  naproxen (NAPROSYN) 500 MG tablet, Take 1 tablet (500 mg total) by mouth 2 (two) times daily with a meal., Disp: 30 tablet, Rfl: 0 .  Omega-3 Fatty Acids (FISH OIL PO), Take 1 capsule by mouth daily. , Disp: , Rfl:  .  traZODone (DESYREL) 50 MG tablet, take 1/2 to 1 tablet by mouth  at bedtime if needed for sleep, Disp: 30 tablet, Rfl: 2 .  famotidine (PEPCID) 40 MG tablet, Take 1 tablet (40 mg total) by mouth every evening. (Patient not taking: Reported on 09/05/2017), Disp: 30 tablet, Rfl: 1  No Known Allergies   Review of Systems  Eyes: Negative for blurred vision.  Respiratory: Negative for shortness of breath.   Cardiovascular: Negative for chest pain and palpitations.  Musculoskeletal: Negative for myalgias.  Neurological: Negative for headaches.  Psychiatric/Behavioral: The patient has insomnia.       Objective  Vitals:   01/25/18 1127  BP: 134/74  Pulse: 83  Temp: 98.3 F (36.8 C)  TempSrc: Oral  SpO2: 98%  Weight: 187 lb 8 oz (85 kg)  Height: 5\' 10"  (1.778 m)    Physical Exam  Constitutional: He is oriented to person, place, and time and well-developed, well-nourished, and in no distress.  Cardiovascular: Normal rate, regular rhythm and normal heart sounds.  No murmur heard. Pulmonary/Chest: Effort normal and breath sounds normal. He has no wheezes.  Neurological: He is alert and oriented to person, place, and time.  Psychiatric: Mood, memory, affect and judgment normal.  Nursing note and vitals reviewed.      Assessment & Plan  1. Essential hypertension BP stable on present anti-hypertensive treatment - metoprolol succinate (TOPROL-XL) 100 MG 24 hr tablet; take 1 tablet by mouth once daily with food  Dispense: 90 tablet; Refill: 0  2. Pure hypercholesterolemia  - atorvastatin (LIPITOR) 20 MG tablet; take 1 tablet by mouth once daily AT 6 PM  Dispense: 90 tablet; Refill: 0  3. Insomnia, unspecified type Taking trazodone as needed, seems to be working well.   Dinara Lupu Asad A. [facility] 01/25/2018 11:51 AM

## 2018-04-06 ENCOUNTER — Other Ambulatory Visit: Payer: Self-pay | Admitting: Family Medicine

## 2018-04-06 DIAGNOSIS — I10 Essential (primary) hypertension: Secondary | ICD-10-CM

## 2018-04-06 MED ORDER — METOPROLOL SUCCINATE ER 100 MG PO TB24: ORAL_TABLET | ORAL | 0 refills | Status: DC

## 2018-04-06 NOTE — Telephone Encounter (Signed)
Copied from [place] [id number]. Topic: Quick Communication - See Telephone Encounter >> Apr 06, 2018  9:41 AM Conception Chancy, NT wrote: CRM for notification. See Telephone encounter for: 04/06/18.  Patient is requesting a refill on metoprolol succinate (TOPROL-XL) 100 MG 24 hr tablet. Please advise. Patient has a appt on 05/15/18 to transfer with Dr. [facility] [phone] - [place], [place] - [address] [place] [address] Phone: [phone] Fax: [phone]

## 2018-05-15 ENCOUNTER — Encounter: Payer: Self-pay | Admitting: Family Medicine

## 2018-05-15 ENCOUNTER — Ambulatory Visit: Payer: PRIVATE HEALTH INSURANCE | Admitting: Family Medicine

## 2018-05-15 VITALS — BP 126/84 | HR 87 | Temp 98.3°F | Resp 14 | Ht 70.0 in | Wt 185.8 lb

## 2018-05-15 DIAGNOSIS — Z5181 Encounter for therapeutic drug level monitoring: Secondary | ICD-10-CM

## 2018-05-15 DIAGNOSIS — E559 Vitamin D deficiency, unspecified: Secondary | ICD-10-CM | POA: Diagnosis not present

## 2018-05-15 DIAGNOSIS — J301 Allergic rhinitis due to pollen: Secondary | ICD-10-CM | POA: Diagnosis not present

## 2018-05-15 DIAGNOSIS — G4700 Insomnia, unspecified: Secondary | ICD-10-CM | POA: Diagnosis not present

## 2018-05-15 DIAGNOSIS — F1010 Alcohol abuse, uncomplicated: Secondary | ICD-10-CM | POA: Diagnosis not present

## 2018-05-15 DIAGNOSIS — E782 Mixed hyperlipidemia: Secondary | ICD-10-CM | POA: Diagnosis not present

## 2018-05-15 DIAGNOSIS — R413 Other amnesia: Secondary | ICD-10-CM | POA: Diagnosis not present

## 2018-05-15 DIAGNOSIS — I10 Essential (primary) hypertension: Secondary | ICD-10-CM | POA: Diagnosis not present

## 2018-05-15 DIAGNOSIS — E7800 Pure hypercholesterolemia, unspecified: Secondary | ICD-10-CM | POA: Diagnosis not present

## 2018-05-15 NOTE — Patient Instructions (Addendum)
Try to process your thoughts earlier in the evening with all electronics off for 15-20 minutes  Try to use PLAIN allergy medicine without the decongestant Avoid: phenylephrine, phenylpropanolamine, and pseudoephredine  No tea after noon Insomnia Insomnia is a sleep disorder that makes it difficult to fall asleep or to stay asleep. Insomnia can cause tiredness (fatigue), low energy, difficulty concentrating, mood swings, and poor performance at work or school. There are three different ways to classify insomnia:  Difficulty falling asleep.  Difficulty staying asleep.  Waking up too early in the morning.  Any type of insomnia can be long-term (chronic) or short-term (acute). Both are common. Short-term insomnia usually lasts for three months or less. Chronic insomnia occurs at least three times a week for longer than three months. What are the causes? Insomnia may be caused by another condition, situation, or substance, such as:  Anxiety.  Certain medicines.  Gastroesophageal reflux disease (GERD) or other gastrointestinal conditions.  Asthma or other breathing conditions.  Restless legs syndrome, sleep apnea, or other sleep disorders.  Chronic pain.  Menopause. This may include hot flashes.  Stroke.  Abuse of alcohol, tobacco, or illegal drugs.  Depression.  Caffeine.  Neurological disorders, such as Alzheimer disease.  An overactive thyroid (hyperthyroidism).  The cause of insomnia may not be known. What increases the risk? Risk factors for insomnia include:  Gender. Women are more commonly affected than men.  Age. Insomnia is more common as you get older.  Stress. This may involve your professional or personal life.  Income. Insomnia is more common in people with lower income.  Lack of exercise.  Irregular work schedule or night shifts.  Traveling between different time zones.  What are the signs or symptoms? If you have insomnia, trouble falling  asleep or trouble staying asleep is the main symptom. This may lead to other symptoms, such as:  Feeling fatigued.  Feeling nervous about going to sleep.  Not feeling rested in the morning.  Having trouble concentrating.  Feeling irritable, anxious, or depressed.  How is this treated? Treatment for insomnia depends on the cause. If your insomnia is caused by an underlying condition, treatment will focus on addressing the condition. Treatment may also include:  Medicines to help you sleep.  Counseling or therapy.  Lifestyle adjustments.  Follow these instructions at home:  Take medicines only as directed by your health care provider.  Keep regular sleeping and waking hours. Avoid naps.  Keep a sleep diary to help you and your health care provider figure out what could be causing your insomnia. Include: ? When you sleep. ? When you wake up during the night. ? How well you sleep. ? How rested you feel the next day. ? Any side effects of medicines you are taking. ? What you eat and drink.  Make your bedroom a comfortable place where it is easy to fall asleep: ? Put up shades or special blackout curtains to block light from outside. ? Use a white noise machine to block noise. ? Keep the temperature cool.  Exercise regularly as directed by your health care provider. Avoid exercising right before bedtime.  Use relaxation techniques to manage stress. Ask your health care provider to suggest some techniques that may work well for you. These may include: ? Breathing exercises. ? Routines to release muscle tension. ? Visualizing peaceful scenes.  Cut back on alcohol, caffeinated beverages, and cigarettes, especially close to bedtime. These can disrupt your sleep.  Do not overeat or eat spicy  foods right before bedtime. This can lead to digestive discomfort that can make it hard for you to sleep.  Limit screen use before bedtime. This includes: ? Watching TV. ? Using your  smartphone, tablet, and computer.  Stick to a routine. This can help you fall asleep faster. Try to do a quiet activity, brush your teeth, and go to bed at the same time each night.  Get out of bed if you are still awake after 15 minutes of trying to sleep. Keep the lights down, but try reading or doing a quiet activity. When you feel sleepy, go back to bed.  Make sure that you drive carefully. Avoid driving if you feel very sleepy.  Keep all follow-up appointments as directed by your health care provider. This is important. Contact a health care provider if:  You are tired throughout the day or have trouble in your daily routine due to sleepiness.  You continue to have sleep problems or your sleep problems get worse. Get help right away if:  You have serious thoughts about hurting yourself or someone else. This information is not intended to replace advice given to you by your health care provider. Make sure you discuss any questions you have with your health care provider. Document Released: 11/26/2000 Document Revised: 04/30/2016 Document Reviewed: 08/30/2014 Elsevier Interactive Patient Education  2018 [employer].  Cholesterol Cholesterol is a fat. Your body needs a small amount of cholesterol. Cholesterol (plaque) may build up in your blood vessels (arteries). That makes you more likely to have a heart attack or stroke. You cannot feel your cholesterol level. Having a blood test is the only way to find out if your level is high. Keep your test results. Work with your doctor to keep your cholesterol at a good level. What do the results mean?  Total cholesterol is how much cholesterol is in your blood.  LDL is bad cholesterol. This is the type that can build up. Try to have low LDL.  HDL is good cholesterol. It cleans your blood vessels and carries LDL away. Try to have high HDL.  Triglycerides are fat that the body can store or burn for energy. What are good levels of  cholesterol?  Total cholesterol below 200.  LDL below 100 is good for people who have health risks. LDL below 70 is good for people who have very high risks.  HDL above 40 is good. It is best to have HDL of 60 or higher.  Triglycerides below 150. How can I lower my cholesterol? Diet Follow your diet program as told by your doctor.  Choose fish, white meat chicken, or [place] that is roasted or baked. Try not to eat red meat, fried foods, sausage, or lunch meats.  Eat lots of fresh fruits and vegetables.  Choose whole grains, beans, pasta, potatoes, and cereals.  Choose olive oil, corn oil, or canola oil. Only use small amounts.  Try not to eat butter, mayonnaise, shortening, or palm kernel oils.  Try not to eat foods with trans fats.  Choose low-fat or nonfat dairy foods. ? Drink skim or nonfat milk. ? Eat low-fat or nonfat yogurt and cheeses. ? Try not to drink whole milk or cream. ? Try not to eat ice cream, egg yolks, or full-fat cheeses.  Healthy desserts include angel food cake, ginger snaps, animal crackers, hard candy, popsicles, and low-fat or nonfat frozen yogurt. Try not to eat pastries, cakes, pies, and cookies.  Exercise Follow your exercise program as told  by your doctor.  Be more active. Try gardening, walking, and taking the stairs.  Ask your doctor about ways that you can be more active.  Medicine  Take over-the-counter and prescription medicines only as told by your doctor. This information is not intended to replace advice given to you by your health care provider. Make sure you discuss any questions you have with your health care provider. Document Released: 02/25/2009 Document Revised: 06/30/2016 Document Reviewed: 06/10/2016 Elsevier Interactive Patient Education  [employer].

## 2018-05-15 NOTE — Assessment & Plan Note (Signed)
Avoid decongestants 

## 2018-05-15 NOTE — Assessment & Plan Note (Signed)
Cut out the tea; try to process thoughts well before bed; continue trazodone; will not prescribe ambien; consider sleep study

## 2018-05-15 NOTE — Progress Notes (Signed)
BP 126/84   Pulse 87   Temp 98.3 F (36.8 C) (Oral)   Resp 14   Ht 5' 10"  (1.778 m)   Wt 185 lb 12.8 oz (84.3 kg)   SpO2 93%   BMI 26.66 kg/m    Subjective:    Patient ID: Jeffrey Yates, male    DOB: 1951-08-08, 67 y.o.   MRN: [id number]  HPI: Jeffrey Yates is a 67 y.o. male  Chief Complaint  Patient presents with  . Follow-up  . Insomnia    TRAZADONE NOT HELPING  . Memory Loss    HPI Patient is new to me; previous provider left our practice  Hypertension Going on for years; does not run in the family; not adding salt to foods  High cholesterol Going on for years; not many fatty meats; not much cheese; maybe 10 eggs a week; on medicines  Insomnia Going on for a few years [place] and trazodone; Lorrin Mais worked better than trazodone; half pill gets him to sleep, then up again 1:30 am; takes another half and dozes off until 5:30 am Never had a sleep study No quiet time during the day Drinks a lot of tea  Memory issues; for the last year; very gradual; not trouble with names Taking b12 vitamins now; low vitamin D, taking 500 iu daily he thinks No fam hx of thyroid trouble No fam hx of memory issues, except for maternal grandmother, they didn't call it "dementia", she sat around all the time and didn't talk; parents were okay  Humira for psoriasis, Dr. Brendolyn Patty Little bit of arthritis in the left elbow  Prostate enlargement; managed by Dr. Rogers Blocker; on dutasteride; doing great  Depression screen [facility] 2/9 05/15/2018 10/24/2017 08/10/2017 06/06/2017 02/14/2017  Decreased Interest 0 0 0 0 0  Down, Depressed, Hopeless 0 0 0 0 0  PHQ - 2 Score 0 0 0 0 0    Relevant past medical, surgical, family and social history reviewed Past Medical History:  Diagnosis Date  . Abnormal liver enzymes 06/29/2016  . Alcohol abuse 01/06/15   Siezure from Withdrawl - 11/26/14  . Allergy   . Gout 09/22/2015  . Hyperlipidemia   . Hypertension   . Neuralgia neuritis, sciatic nerve  06/29/2016  . Psoriasis 09/22/2015  . Seizure (Pleak) 07/14/2015   Past Surgical History:  Procedure Laterality Date  . CHOLECYSTECTOMY  2007  . COLONOSCOPY W/ POLYPECTOMY  2019   cancerous polyp  . COLONOSCOPY WITH PROPOFOL N/A 09/08/2017   Procedure: COLONOSCOPY WITH PROPOFOL;  Surgeon: Lucilla Lame, MD;  Location: [place];  Service: Gastroenterology;  Laterality: N/A;  . FOOT SURGERY Right   . [place] SURGERY  2007  . POLYPECTOMY N/A 09/08/2017   Procedure: POLYPECTOMY;  Surgeon: Lucilla Lame, MD;  Location: [place];  Service: Gastroenterology;  Laterality: N/A;  . ROTATOR CUFF REPAIR Left   . VASECTOMY     Family History  Problem Relation Age of Onset  . Alcohol abuse Paternal Grandfather    Social History   Tobacco Use  . Smoking status: Light Tobacco Smoker    Packs/day: 0.25  . Smokeless tobacco: Never Used  . Tobacco comment: 1 PPWeek, Smoked 10 yrs, quit for 17, restarted 1.5 yrs ago  Substance Use Topics  . Alcohol use: Yes    Alcohol/week: 1.8 oz    Types: 3 Shots of liquor per week    Comment: martine 3x week  . Drug use: No  MD note:  3 drinks a week  Interim medical history since last visit reviewed. Allergies and medications reviewed  Review of Systems Per HPI unless specifically indicated above     Objective:    BP 126/84   Pulse 87   Temp 98.3 F (36.8 C) (Oral)   Resp 14   Ht 5' 10"  (1.778 m)   Wt 185 lb 12.8 oz (84.3 kg)   SpO2 93%   BMI 26.66 kg/m   Wt Readings from Last 3 Encounters:  05/15/18 185 lb 12.8 oz (84.3 kg)  01/25/18 187 lb 8 oz (85 kg)  10/24/17 187 lb 9.6 oz (85.1 kg)    Physical Exam  Constitutional: He appears well-developed and well-nourished. No distress.  HENT:  Head: Normocephalic and atraumatic.  Shiny flat tongue, loss of papillae  Eyes: EOM are normal. No scleral icterus.  Neck: No thyromegaly present.  Cardiovascular: Normal rate and regular rhythm.  Pulmonary/Chest: Effort normal and  breath sounds normal.  Abdominal: Soft. Bowel sounds are normal. He exhibits no distension.  Musculoskeletal: He exhibits no edema.  Neurological: Coordination normal.  Reflex Scores:      Patellar reflexes are 2+ on the right side and 1+ on the left side. Skin: Skin is warm and dry. No pallor.  Psychiatric: He has a normal mood and affect. His behavior is normal. Judgment and thought content normal.    Results for orders placed or performed in visit on 08/10/17  CBC with Differential/Platelet  Result Value Ref Range   WBC 5.0 3.8 - 10.8 K/uL   RBC 4.34 4.20 - 5.80 MIL/uL   Hemoglobin 14.2 13.2 - 17.1 g/dL   HCT 42.3 38.5 - 50.0 %   MCV 97.5 80.0 - 100.0 fL   MCH 32.7 27.0 - 33.0 pg   MCHC 33.6 32.0 - 36.0 g/dL   RDW 13.5 11.0 - 15.0 %   Platelets 194 140 - 400 K/uL   MPV 9.5 7.5 - 12.5 fL   Neutro Abs 3,150 1,500 - 7,800 cells/uL   Lymphs Abs 1,250 850 - 3,900 cells/uL   Monocytes Absolute 450 200 - 950 cells/uL   Eosinophils Absolute 100 15 - 500 cells/uL   Basophils Absolute 50 0 - 200 cells/uL   Neutrophils Relative % 63 %   Lymphocytes Relative 25 %   Monocytes Relative 9 %   Eosinophils Relative 2 %   Basophils Relative 1 %   Smear Review Criteria for review not met   COMPLETE METABOLIC PANEL WITH GFR  Result Value Ref Range   Sodium 138 135 - 146 mmol/L   Potassium 4.2 3.5 - 5.3 mmol/L   Chloride 103 98 - 110 mmol/L   CO2 25 20 - 32 mmol/L   Glucose, Bld 79 65 - 99 mg/dL   BUN 18 7 - 25 mg/dL   Creat 0.75 0.70 - 1.25 mg/dL   Total Bilirubin 2.2 (H) 0.2 - 1.2 mg/dL   Alkaline Phosphatase 60 40 - 115 U/L   AST 31 10 - 35 U/L   ALT 22 9 - 46 U/L   Total Protein 6.7 6.1 - 8.1 g/dL   Albumin 4.3 3.6 - 5.1 g/dL   Calcium 9.1 8.6 - 10.3 mg/dL   GFR, Est African American >89 >=60 mL/min   GFR, Est Non African American >89 >=60 mL/min  TSH  Result Value Ref Range   TSH 1.22 0.40 - 4.50 mIU/L  VITAMIN D 25 Hydroxy (Vit-D Deficiency, Fractures)  Result Value Ref  Range  Vit D, 25-Hydroxy 26 (L) 30 - 100 ng/mL  Hepatitis C antibody  Result Value Ref Range   HCV Ab NON-REACTIVE NON-REACTIVE  PSA  Result Value Ref Range   PSA 0.6 <=4.0 ng/mL      Assessment & Plan:   Problem List Items Addressed This Visit      Cardiovascular and Mediastinum   Hypertension    controlled        Respiratory   Allergic rhinitis    Avoid decongestants        Other   Insomnia    Cut out the tea; try to process thoughts well before bed; continue trazodone; will not prescribe ambien; consider sleep study      Hyperlipidemia    Check lipids today; limit egg yolks to 3 per week      Relevant Orders   Lipid panel   AA (alcohol abuse)    Patient now drinking 3 drinks a week       Other Visit Diagnoses    Memory difficulties    -  Primary   will check labs; refer to neuro if all normal   Relevant Orders   Vitamin B12   VITAMIN D 25 Hydroxy (Vit-D Deficiency, Fractures)   TSH   Medication monitoring encounter       Relevant Orders   COMPLETE METABOLIC PANEL WITH GFR   Vitamin D deficiency       Relevant Orders   VITAMIN D 25 Hydroxy (Vit-D Deficiency, Fractures)       Follow up plan: Return in about 6 months (around 11/14/2018) for follow-up visit with Dr. Sanda Klein; Medicare Wellness visit when due with Ammie.  An after-visit summary was printed and given to the patient at [place].  Please see the patient instructions which may contain other information and recommendations beyond what is mentioned above in the assessment and plan.  No orders of the defined types were placed in this encounter.   Orders Placed This Encounter  Procedures  . Vitamin B12  . VITAMIN D 25 Hydroxy (Vit-D Deficiency, Fractures)  . TSH  . Lipid panel  . COMPLETE METABOLIC PANEL WITH GFR

## 2018-05-15 NOTE — Assessment & Plan Note (Signed)
controlled 

## 2018-05-15 NOTE — Assessment & Plan Note (Signed)
Check lipids today; limit egg yolks to 3 per week

## 2018-05-15 NOTE — Assessment & Plan Note (Signed)
Patient now drinking 3 drinks a week

## 2018-05-16 LAB — LIPID PANEL
CHOLESTEROL: 166 mg/dL (ref <200)
HDL: 69 mg/dL (ref >40)
LDL CHOLESTEROL (CALC): 72 mg/dL
Non-HDL Cholesterol (Calc): 97 mg/dL (calc) (ref <130)
Total CHOL/HDL Ratio: 2.4 (calc) (ref <5.0)
Triglycerides: 170 mg/dL — ABNORMAL HIGH (ref <150)

## 2018-05-16 LAB — VITAMIN B12: Vitamin B-12: 651 pg/mL (ref 200–1100)

## 2018-05-16 LAB — COMPLETE METABOLIC PANEL WITH GFR
AG RATIO: 2 (calc) (ref 1.0–2.5)
ALBUMIN MSPROF: 4.7 g/dL (ref 3.6–5.1)
ALKALINE PHOSPHATASE (APISO): 68 U/L (ref 40–115)
ALT: 17 U/L (ref 9–46)
AST: 29 U/L (ref 10–35)
BILIRUBIN TOTAL: 1.8 mg/dL — AB (ref 0.2–1.2)
BUN: 17 mg/dL (ref 7–25)
CHLORIDE: 101 mmol/L (ref 98–110)
CO2: 29 mmol/L (ref 20–32)
Calcium: 9.6 mg/dL (ref 8.6–10.3)
Creat: 0.87 mg/dL (ref 0.70–1.25)
GFR, EST AFRICAN AMERICAN: 104 mL/min/{1.73_m2} (ref >=60)
GFR, Est Non African American: 90 mL/min/{1.73_m2} (ref >=60)
GLOBULIN: 2.4 g/dL (ref 1.9–3.7)
Glucose, Bld: 94 mg/dL (ref 65–139)
Potassium: 4.1 mmol/L (ref 3.5–5.3)
SODIUM: 138 mmol/L (ref 135–146)
TOTAL PROTEIN: 7.1 g/dL (ref 6.1–8.1)

## 2018-05-16 LAB — TSH: TSH: 2.74 m[IU]/L (ref 0.40–4.50)

## 2018-05-16 LAB — VITAMIN D 25 HYDROXY (VIT D DEFICIENCY, FRACTURES): VIT D 25 HYDROXY: 33 ng/mL (ref 30–100)

## 2018-05-17 ENCOUNTER — Encounter: Payer: Self-pay | Admitting: Family Medicine

## 2018-05-17 HISTORY — DX: Gilbert syndrome: E80.4

## 2018-06-28 ENCOUNTER — Ambulatory Visit: Payer: Self-pay | Admitting: Podiatry

## 2018-07-15 ENCOUNTER — Other Ambulatory Visit: Payer: Self-pay | Admitting: Family Medicine

## 2018-07-15 DIAGNOSIS — I10 Essential (primary) hypertension: Secondary | ICD-10-CM

## 2018-07-20 ENCOUNTER — Other Ambulatory Visit: Payer: Self-pay | Admitting: Family Medicine

## 2018-07-20 DIAGNOSIS — E7800 Pure hypercholesterolemia, unspecified: Secondary | ICD-10-CM

## 2018-07-21 NOTE — Telephone Encounter (Signed)
Last SGPT and lipids reviweed; rx approved

## 2018-10-23 ENCOUNTER — Ambulatory Visit: Payer: No Typology Code available for payment source | Admitting: Family Medicine

## 2018-11-03 ENCOUNTER — Ambulatory Visit: Payer: No Typology Code available for payment source | Admitting: Family Medicine

## 2019-01-10 DIAGNOSIS — R413 Other amnesia: Secondary | ICD-10-CM | POA: Insufficient documentation

## 2019-02-21 ENCOUNTER — Telehealth: Payer: Self-pay | Admitting: Family Medicine

## 2019-02-21 DIAGNOSIS — I10 Essential (primary) hypertension: Secondary | ICD-10-CM

## 2019-02-22 ENCOUNTER — Other Ambulatory Visit: Payer: Self-pay | Admitting: Family Medicine

## 2019-02-22 DIAGNOSIS — I10 Essential (primary) hypertension: Secondary | ICD-10-CM

## 2019-02-22 NOTE — Telephone Encounter (Signed)
Left voice message informing that script has been sent to pharmacy and to return call to schedule appt with Lada and Arbutus Ped

## 2019-02-22 NOTE — Telephone Encounter (Signed)
Patient was due to be seen for follow-up in December 2019 I do not see any future appointments with me He was also due to have a Medicare AWV, please schedule through [facility] provide limited refill of med and we'll see him soon Thank you

## 2019-02-23 NOTE — Telephone Encounter (Signed)
Scheduled pt with lada on 03/13/19 but still needs to speak with [place] [employer] to schedule first AWV

## 2019-03-13 ENCOUNTER — Ambulatory Visit (INDEPENDENT_AMBULATORY_CARE_PROVIDER_SITE_OTHER): Payer: Self-pay | Admitting: Family Medicine

## 2019-03-13 ENCOUNTER — Encounter: Payer: Self-pay | Admitting: Family Medicine

## 2019-03-13 DIAGNOSIS — R413 Other amnesia: Secondary | ICD-10-CM

## 2019-03-13 DIAGNOSIS — Z5181 Encounter for therapeutic drug level monitoring: Secondary | ICD-10-CM

## 2019-03-13 DIAGNOSIS — E7800 Pure hypercholesterolemia, unspecified: Secondary | ICD-10-CM

## 2019-03-13 DIAGNOSIS — G4700 Insomnia, unspecified: Secondary | ICD-10-CM

## 2019-03-13 DIAGNOSIS — N400 Enlarged prostate without lower urinary tract symptoms: Secondary | ICD-10-CM

## 2019-03-13 DIAGNOSIS — I10 Essential (primary) hypertension: Secondary | ICD-10-CM

## 2019-03-13 MED ORDER — METOPROLOL SUCCINATE ER 100 MG PO TB24: ORAL_TABLET | ORAL | 1 refills | Status: DC

## 2019-03-13 MED ORDER — ATORVASTATIN CALCIUM 20 MG PO TABS: ORAL_TABLET | ORAL | 2 refills | Status: DC

## 2019-03-13 MED ORDER — TRAZODONE HCL 50 MG PO TABS
50.0000 mg | ORAL_TABLET | Freq: Every evening | ORAL | 2 refills | Status: DC | PRN
Start: 2019-03-13 — End: 2019-10-26

## 2019-03-13 NOTE — Assessment & Plan Note (Signed)
Managed by neurologist; patient on B12 and B compex vitamins, taking Namenda

## 2019-03-13 NOTE — Progress Notes (Signed)
There were no vitals taken for this visit.   Subjective:    Patient ID: Jeffrey Yates, male    DOB: Sep 15, 1951, 68 y.o.   MRN: [id number]  HPI: Jeffrey Yates is a 68 y.o. male  Chief Complaint  Patient presents with  . Follow-up    HPI Virtual Visit via Telephone Note   I connected with Jeffrey Yates on March 13, 2019 at 8:23 am EDT by telephone and verified that I am speaking with the correct person using two identifiers.   Staff discussed the limitations, risks, security, and privacy concerns of performing an evaluation and management service by telephone and the availability of in-person appointments. Staff discussed with the patient that he/she may be responsible for charges related to this service. The patient expressed understanding and agreed to proceed. The patient was asked to check her surroundings, be sure to keep phone off of speaker, let me know immediately if someone comes near and our conversation has to be terminated or paused for privacy concerns.  Additional participants: none  Patient location: home Provider location: home  Call started: 8:23 am Call terminated: 8:35 am Total length of call: 12 minutes  He feels pretty good; taking supplements Taking both b complex and b12  High cholesterol; no side effects from the statin; he stays away from red meat the best he can Lab Results  Component Value Date   CHOL 166 05/15/2018   HDL 69 05/15/2018   LDLCALC 72 05/15/2018   TRIG 170 (H) 05/15/2018   CHOLHDL 2.4 05/15/2018    He has been working hard to lose weight; down 15 pounds over the last 6 months; lots more veggies; fish; most recent weight 180-185 pounds  Hypertension; taking metoprolol; he does not check his BP; he feels really good; very good; not much salt  Prostate issues; seeing Dr. Rogers Blocker managing; urine stream is good  Humira is no longer; his skin has been pretty good; stopped in October  Taking trazodone for sleep when needed; just half of  a pill once a week  Taking Namenda, he says he was having memory issues; he is taking Namenda now 5 mg BID, [name], neurologist at [facility]  Depression screen [facility] 2/9 03/13/2019 05/15/2018 10/24/2017 08/10/2017 06/06/2017  Decreased Interest 0 0 0 0 0  Down, Depressed, Hopeless 0 0 0 0 0  PHQ - 2 Score 0 0 0 0 0  Altered sleeping 0 - - - -  Tired, decreased energy 0 - - - -  Change in appetite 0 - - - -  Feeling bad or failure about yourself  0 - - - -  Trouble concentrating 0 - - - -  Moving slowly or fidgety/restless 0 - - - -  Suicidal thoughts 0 - - - -  PHQ-9 Score 0 - - - -  Difficult doing work/chores Not difficult at all - - - -   Fall Risk  03/13/2019 05/15/2018 10/24/2017 08/10/2017 06/06/2017  Falls in the past year? 0 No No Yes No  Number falls in past yr: - - - 1 -  Injury with Fall? - - - Yes -  Comment - - - bumped head -  Risk for fall due to : - - - Impaired balance/gait -  Risk for fall due to: Comment - - - - -  Follow up - - - Falls evaluation completed -    Relevant past medical, surgical, family and social history reviewed Past Medical History:  Diagnosis Date  . Abnormal liver enzymes 06/29/2016  . Alcohol abuse 01/06/15   Siezure from Withdrawl - 11/26/14  . Allergy   . Gilbert's disease 05/17/2018   Seen by GI 2017  . Gout 09/22/2015  . Hyperlipidemia   . Hypertension   . Neuralgia neuritis, sciatic nerve 06/29/2016  . Psoriasis 09/22/2015  . Seizure ([place]) 07/14/2015   Past Surgical History:  Procedure Laterality Date  . CHOLECYSTECTOMY  2007  . COLONOSCOPY W/ POLYPECTOMY  2019   cancerous polyp  . COLONOSCOPY WITH PROPOFOL N/A 09/08/2017   Procedure: COLONOSCOPY WITH PROPOFOL;  Surgeon: Lucilla Lame, MD;  Location: [facility];  Service: Gastroenterology;  Laterality: N/A;  . FOOT SURGERY Right   . [place] SURGERY  2007  . POLYPECTOMY N/A 09/08/2017   Procedure: POLYPECTOMY;  Surgeon: Lucilla Lame, MD;  Location: [facility];   Service: Gastroenterology;  Laterality: N/A;  . ROTATOR CUFF REPAIR Left   . VASECTOMY     Family History  Problem Relation Age of Onset  . Alcohol abuse Paternal Grandfather    Social History   Tobacco Use  . Smoking status: Light Tobacco Smoker    Packs/day: 0.25  . Smokeless tobacco: Never Used  . Tobacco comment: 1 PPWeek, Smoked 10 yrs, quit for 17, restarted 1.5 yrs ago  Substance Use Topics  . Alcohol use: Yes    Alcohol/week: 3.0 standard drinks    Types: 3 Shots of liquor per week    Comment: martine 3x week  . Drug use: No     [facility] Visit from 03/13/2019 in [facility]  AUDIT-C Score  3      Interim medical history since last visit reviewed. Allergies and medications reviewed  Review of Systems Per HPI unless specifically indicated above     Objective:    There were no vitals taken for this visit.  Wt Readings from Last 3 Encounters:  05/15/18 185 lb 12.8 oz (84.3 kg)  01/25/18 187 lb 8 oz (85 kg)  10/24/17 187 lb 9.6 oz (85.1 kg)    Physical Exam Neurological:     Mental Status: He is alert.     Cranial Nerves: No dysarthria.  Psychiatric:        Speech: Speech is not rapid and pressured, delayed or slurred.     Results for orders placed or performed in visit on 05/15/18  Vitamin B12  Result Value Ref Range   Vitamin B-12 651 200 - 1,100 pg/mL  VITAMIN D 25 Hydroxy (Vit-D Deficiency, Fractures)  Result Value Ref Range   Vit D, 25-Hydroxy 33 30 - 100 ng/mL  TSH  Result Value Ref Range   TSH 2.74 0.40 - 4.50 mIU/L  Lipid panel  Result Value Ref Range   Cholesterol 166 <200 mg/dL   HDL 69 >40 mg/dL   Triglycerides 170 (H) <150 mg/dL   LDL Cholesterol (Calc) 72 mg/dL (calc)   Total CHOL/HDL Ratio 2.4 <5.0 (calc)   Non-HDL Cholesterol (Calc) 97 <130 mg/dL (calc)  COMPLETE METABOLIC PANEL WITH GFR  Result Value Ref Range   Glucose, Bld 94 65 - 139 mg/dL   BUN 17 7 - 25 mg/dL   Creat 0.87 0.70 - 1.25 mg/dL   GFR, Est  Non African American 90 > OR = 60 mL/min/1.12m2   GFR, Est African American 104 > OR = 60 mL/min/1.10m2   BUN/Creatinine Ratio NOT APPLICABLE 6 - 22 (calc)   Sodium 138 135 - 146 mmol/L  Potassium 4.1 3.5 - 5.3 mmol/L   Chloride 101 98 - 110 mmol/L   CO2 29 20 - 32 mmol/L   Calcium 9.6 8.6 - 10.3 mg/dL   Total Protein 7.1 6.1 - 8.1 g/dL   Albumin 4.7 3.6 - 5.1 g/dL   Globulin 2.4 1.9 - 3.7 g/dL (calc)   AG Ratio 2.0 1.0 - 2.5 (calc)   Total Bilirubin 1.8 (H) 0.2 - 1.2 mg/dL   Alkaline phosphatase (APISO) 68 40 - 115 U/L   AST 29 10 - 35 U/L   ALT 17 9 - 46 U/L      Assessment & Plan:   Problem List Items Addressed This Visit      Cardiovascular and Mediastinum   Hypertension    Patient reports feeling great; he denies dizziness, light-headedness, weakness; I explained that with weigh tloss, he may not need as much BP medicine; if he can check it and it's running low, we can reduce his BP medicine, cut it in half; refills of med sent; he is limiting salt      Relevant Medications   atorvastatin (LIPITOR) 20 MG tablet   metoprolol succinate (TOPROL-XL) 100 MG 24 hr tablet     Genitourinary   Benign non-nodular prostatic hyperplasia without lower urinary tract symptoms    Managed by urologist; patient on medicine, improved sx        Other   Insomnia   Relevant Medications   traZODone (DESYREL) 50 MG tablet   Loss of memory    Managed by neurologist; patient on B12 and B compex vitamins, taking Namenda      Hyperlipidemia    Because of the COVID-19 pandemic, will keep patient out of the office; return for labs in early June if safe to do so then; he is avoiding red meat; continue statin      Relevant Medications   atorvastatin (LIPITOR) 20 MG tablet   metoprolol succinate (TOPROL-XL) 100 MG 24 hr tablet       Follow up plan: No follow-ups on file.  An after-visit summary was printed and given to the patient at [place].  Please see the patient instructions  which may contain other information and recommendations beyond what is mentioned above in the assessment and plan.  Meds ordered this encounter  Medications  . traZODone (DESYREL) 50 MG tablet    Sig: Take 1 tablet (50 mg total) by mouth at bedtime as needed for sleep.    Dispense:  30 tablet    Refill:  2  . atorvastatin (LIPITOR) 20 MG tablet    Sig: TAKE 1 TABLET BY MOUTH ONCE DAILY AT 6PM    Dispense:  90 tablet    Refill:  2  . metoprolol succinate (TOPROL-XL) 100 MG 24 hr tablet    Sig: TAKE 1 TABLET BY MOUTH EVERY DAY WITH FOOD  * appointment needed please *    Dispense:  90 tablet    Refill:  1    No orders of the defined types were placed in this encounter.

## 2019-03-13 NOTE — Assessment & Plan Note (Signed)
Patient reports feeling great; he denies dizziness, light-headedness, weakness; I explained that with weigh tloss, he may not need as much BP medicine; if he can check it and it's running low, we can reduce his BP medicine, cut it in half; refills of med sent; he is limiting salt

## 2019-03-13 NOTE — Assessment & Plan Note (Addendum)
Because of the COVID-19 pandemic, will keep patient out of the office; return for labs in early June if safe to do so then; he is avoiding red meat; continue statin

## 2019-03-13 NOTE — Assessment & Plan Note (Signed)
Managed by urologist; patient on medicine, improved sx

## 2019-04-16 ENCOUNTER — Telehealth: Payer: Self-pay

## 2019-04-16 DIAGNOSIS — L821 Other seborrheic keratosis: Secondary | ICD-10-CM | POA: Diagnosis not present

## 2019-04-16 DIAGNOSIS — L400 Psoriasis vulgaris: Secondary | ICD-10-CM | POA: Diagnosis not present

## 2019-04-16 NOTE — Telephone Encounter (Signed)
Copied from [place] [id number]. Topic: Referral - Request for Referral >> Apr 12, 2019  1:40 PM Rainey Pines A wrote: Has patient seen PCP for this complaint? Yes *If NO, is insurance requiring patient see PCP for this issue before PCP can refer them? Referral for which specialty: Orthopedics Preferred provider/office: [facility], [place], [place] [address] [phone] Reason for referral: Patient stated that his fingers are drawing up in both hands. Patient feels he has dupuytrens.

## 2019-04-16 NOTE — Telephone Encounter (Signed)
I do not see this issue mentioned in Dr. Delight Ovens last note. Please ask patient to schedule appointment to be seen and treated and for appropriate referral as needed. Typically insurance require to be seen by pcp before specialist.

## 2019-04-17 NOTE — Telephone Encounter (Signed)
appt scheduled

## 2019-04-18 ENCOUNTER — Other Ambulatory Visit: Payer: Self-pay

## 2019-04-18 ENCOUNTER — Encounter: Payer: Self-pay | Admitting: Nurse Practitioner

## 2019-04-18 ENCOUNTER — Ambulatory Visit (INDEPENDENT_AMBULATORY_CARE_PROVIDER_SITE_OTHER): Payer: Self-pay | Admitting: Nurse Practitioner

## 2019-04-18 DIAGNOSIS — I10 Essential (primary) hypertension: Secondary | ICD-10-CM

## 2019-04-18 DIAGNOSIS — M720 Palmar fascial fibromatosis [Dupuytren]: Secondary | ICD-10-CM

## 2019-04-18 DIAGNOSIS — E782 Mixed hyperlipidemia: Secondary | ICD-10-CM

## 2019-04-18 DIAGNOSIS — N400 Enlarged prostate without lower urinary tract symptoms: Secondary | ICD-10-CM

## 2019-04-18 DIAGNOSIS — G4700 Insomnia, unspecified: Secondary | ICD-10-CM

## 2019-04-18 NOTE — Progress Notes (Signed)
Virtual Visit via Video Note  I connected with Jeffrey Yates on 04/18/19 at 10:20 AM EDT by a video enabled telemedicine application and verified that I am speaking with the correct person using two identifiers.   Staff discussed the limitations of evaluation and management by telemedicine and the availability of in person appointments. The patient expressed understanding and agreed to proceed.  Patient location: home  My location: work office Other people present:  None, daughterLovena Le.  HPI  Patient notes 4th and 5th digit stay flexed on his left hand about 5 years ago but got drastically worsened. States he didn't get seen because he is right handed but he is noticing it happening on his right hand now. States her can feel thickening in his palm and fingers are starting to contract can rub them back up on his right hand presently  Denies Repetitive hand work, diabetes mellitus, endorses smokes a pack a week, alcohol use-daily.   Patient states has been eating well and increasing exercise States he decided to stop taking all his medications except for supplements over a week ago because he has been feeling well and has lost over 30 pounds through healthy changes.  No issues with urinary flow issues noted since stopping his dutasteride  Does not have a way to check his blood pressure, denies headaches, chest pain, blurry vision, palpitations.  States he rarely takes trazadone only if he doesn't sleep well here an there.   He does not want to go back on the medications but see how he is doing off of them.   PHQ2/9: Depression screen [facility] 2/9 03/13/2019 05/15/2018 10/24/2017 08/10/2017 06/06/2017  Decreased Interest 0 0 0 0 0  Down, Depressed, Hopeless 0 0 0 0 0  PHQ - 2 Score 0 0 0 0 0  Altered sleeping 0 - - - -  Tired, decreased energy 0 - - - -  Change in appetite 0 - - - -  Feeling bad or failure about yourself  0 - - - -  Trouble concentrating 0 - - - -  Moving slowly or  fidgety/restless 0 - - - -  Suicidal thoughts 0 - - - -  PHQ-9 Score 0 - - - -  Difficult doing work/chores Not difficult at all - - - -     Patient Active Problem List   Diagnosis Date Noted  . Loss of memory 01/10/2019  . Gilbert's disease 05/17/2018  . Insomnia 05/15/2018  . Neuralgia neuritis, sciatic nerve 06/29/2016  . Gonalgia 06/29/2016  . Dupuytren's contracture 05/31/2016  . Hyperbilirubinemia 05/26/2016  . Snoring 05/26/2016  . Gout 09/22/2015  . Psoriasis 09/22/2015  . Hypertension 07/14/2015  . Hyperlipidemia 07/14/2015  . History of gout 07/14/2015  . AA (alcohol abuse) 07/14/2015  . Allergic rhinitis 07/14/2015  . Hypercholesteremia 07/14/2015  . Seizure ([place]) 07/14/2015  . Benign non-nodular prostatic hyperplasia without lower urinary tract symptoms 11/26/2014    Past Medical History:  Diagnosis Date  . Abnormal liver enzymes 06/29/2016  . Alcohol abuse 01/06/15   Siezure from Withdrawl - 11/26/14  . Allergy   . Gilbert's disease 05/17/2018   Seen by GI 2017  . Gout 09/22/2015  . Hyperlipidemia   . Hypertension   . Neuralgia neuritis, sciatic nerve 06/29/2016  . Psoriasis 09/22/2015  . Seizure ([place]) 07/14/2015    Past Surgical History:  Procedure Laterality Date  . CHOLECYSTECTOMY  2007  . COLONOSCOPY W/ POLYPECTOMY  2019   cancerous polyp  . COLONOSCOPY  WITH PROPOFOL N/A 09/08/2017   Procedure: COLONOSCOPY WITH PROPOFOL;  Surgeon: Lucilla Lame, MD;  Location: [facility];  Service: Gastroenterology;  Laterality: N/A;  . FOOT SURGERY Right   . [place] SURGERY  2007  . POLYPECTOMY N/A 09/08/2017   Procedure: POLYPECTOMY;  Surgeon: Lucilla Lame, MD;  Location: [facility];  Service: Gastroenterology;  Laterality: N/A;  . ROTATOR CUFF REPAIR Left   . VASECTOMY      Social History   Tobacco Use  . Smoking status: Light Tobacco Smoker    Packs/day: 0.25  . Smokeless tobacco: Never Used  . Tobacco comment: 1 PPWeek, Smoked 10 yrs,  quit for 17, restarted 1.5 yrs ago  Substance Use Topics  . Alcohol use: Yes    Alcohol/week: 3.0 standard drinks    Types: 3 Shots of liquor per week    Comment: martine 3x week     Current Outpatient Medications:  .  ascorbic acid (VITAMIN C) 1000 MG tablet, Take 1,000 mg by mouth daily., Disp: , Rfl:  .  atorvastatin (LIPITOR) 20 MG tablet, TAKE 1 TABLET BY MOUTH ONCE DAILY AT 6PM, Disp: 90 tablet, Rfl: 2 .  B Complex Vitamins (VITAMIN B COMPLEX IJ), Take 1 tablet by mouth., Disp: , Rfl:  .  Cholecalciferol (VITAMIN D3) 1000 UNITS CAPS, Take 1 capsule by mouth., Disp: , Rfl:  .  dutasteride (AVODART) 0.5 MG capsule, Take 0.5 mg by mouth daily. , Disp: , Rfl:  .  memantine (NAMENDA) 5 MG tablet, Take 5 mg by mouth 2 (two) times daily., Disp: , Rfl:  .  metoprolol succinate (TOPROL-XL) 100 MG 24 hr tablet, TAKE 1 TABLET BY MOUTH EVERY DAY WITH FOOD  * appointment needed please *, Disp: 90 tablet, Rfl: 1 .  Omega-3 Fatty Acids (FISH OIL PO), Take 1 capsule by mouth daily. , Disp: , Rfl:  .  traZODone (DESYREL) 50 MG tablet, Take 1 tablet (50 mg total) by mouth at bedtime as needed for sleep., Disp: 30 tablet, Rfl: 2 .  vitamin B-12 (CYANOCOBALAMIN) 1000 MCG tablet, Take 1,000 mcg by mouth daily., Disp: , Rfl:   No Known Allergies  ROS   No other specific complaints in a complete review of systems (except as listed in HPI above).  Objective  There were no vitals filed for this visit.   There is no height or weight on file to calculate BMI.  Nursing Note and Vital Signs reviewed.  Physical Exam   Constitutional: Patient appears well-developed and well-nourished. No distress.  HENT: Head: Normocephalic and atraumatic. Pulmonary/Chest: Effort normal  Musculoskeletal: left hand 4th and 5th digit are flexed unable to straighten. Pinching in palm noted in right hand.  Neurological: he is alert and oriented to person, place, and time. speech  normal.  Skin: No rash noted. No  erythema.  Psychiatric: Patient has a normal mood and affect. behavior is normal. Judgment and thought content normal.    Assessment & Plan  1. Dupuytren contracture - Ambulatory referral to Orthopedic Surgery  2. Essential hypertension Diet, will get blood pressure monitor and check blood pressures 3 times a week and keep report for 6 week follow-up if noted to be above goal 140/90 will come in sooner  3. Mixed hyperlipidemia Reassess at follow-up  4. Insomnia, unspecified type Stable   5. Benign non-nodular prostatic hyperplasia without lower urinary tract symptoms Asymptomatic presently    Follow Up Instructions:   6 weeks  I discussed the assessment and treatment  plan with the patient. The patient was provided an opportunity to ask questions and all were answered. The patient agreed with the plan and demonstrated an understanding of the instructions.   The patient was advised to call back or seek an in-person evaluation if the symptoms worsen or if the condition fails to improve as anticipated.  I provided 22 minutes of non-face-to-face time during this encounter, 16 minutes on video and additional time in chart.   Fredderick Severance, NP

## 2019-04-26 DIAGNOSIS — E8729 Other acidosis: Secondary | ICD-10-CM | POA: Insufficient documentation

## 2019-04-26 DIAGNOSIS — E872 Acidosis: Secondary | ICD-10-CM | POA: Insufficient documentation

## 2019-07-03 ENCOUNTER — Ambulatory Visit (INDEPENDENT_AMBULATORY_CARE_PROVIDER_SITE_OTHER): Payer: Medicare HMO | Admitting: Nurse Practitioner

## 2019-07-03 ENCOUNTER — Other Ambulatory Visit: Payer: Self-pay

## 2019-07-03 ENCOUNTER — Encounter: Payer: Self-pay | Admitting: Nurse Practitioner

## 2019-07-03 VITALS — BP 164/82 | HR 72 | Temp 96.9°F | Resp 14 | Ht 70.5 in | Wt 175.4 lb

## 2019-07-03 DIAGNOSIS — I10 Essential (primary) hypertension: Secondary | ICD-10-CM

## 2019-07-03 DIAGNOSIS — R413 Other amnesia: Secondary | ICD-10-CM | POA: Diagnosis not present

## 2019-07-03 DIAGNOSIS — S80862A Insect bite (nonvenomous), left lower leg, initial encounter: Secondary | ICD-10-CM | POA: Diagnosis not present

## 2019-07-03 DIAGNOSIS — W57XXXA Bitten or stung by nonvenomous insect and other nonvenomous arthropods, initial encounter: Secondary | ICD-10-CM

## 2019-07-03 DIAGNOSIS — R21 Rash and other nonspecific skin eruption: Secondary | ICD-10-CM | POA: Diagnosis not present

## 2019-07-03 DIAGNOSIS — E7800 Pure hypercholesterolemia, unspecified: Secondary | ICD-10-CM | POA: Diagnosis not present

## 2019-07-03 NOTE — Patient Instructions (Addendum)
-Please call when you get home to tell us the name and dosage and frequency of the blood pressure medication that you are taking. Your goal blood pressure is less than 140 mmHg on top. Try to follow the DASH guidelines (DASH stands for Dietary Approaches to Stop Hypertension) Try to limit the sodium in your diet.  Ideally, consume less than 1.5 grams (less than 1,500mg ) per day. Do not add salt when cooking or at the table.  Check the sodium amount on labels when shopping, and choose items lower in sodium when given a choice. Avoid or limit foods that already contain a lot of sodium. Eat a diet rich in fruits and vegetables and whole grains.  -Keep an eye on the area of redness by the tick bite.  I recommend with the skin safe marker marking around the redness area, if redness extends past the markings please follow-up sooner.  If the area becomes hot to touch is painful or oozing please follow-up immediately.  If you develop nausea, numbness or tingling, vomiting, myalgias, new rashes please follow-up immediately.  Please take a daily antihistamine such as Claritin or Zyrtec over-the-counter once daily for the next week.  You can apply a small amount of Neosporin and hydrocortisone to the rash area for comfort and prevention for the next week or until the area resolves.  Tick Bite Information, Adult Ticks are insects that draw blood for food. Most ticks live in shrubs and grassy areas. They climb onto people and animals that brush against the leaves and grasses that they rest on. Then they bite, attaching themselves to the skin. Most ticks are harmless, but some ticks carry germs that can spread to a person through a bite and cause a disease. To reduce your risk of getting a disease from a tick bite, it is important to take steps to prevent tick bites. It is also important to check for ticks after being outdoors. If you find that a tick has attached to you, watch for symptoms of disease. How can I  prevent tick bites? Take these steps to help prevent tick bites when you are outdoors in an area where ticks are found:  Use insect repellent that has DEET (20% or higher), picaridin, or IR3535 in it. Use it on: ? Skin that is showing. ? The top of your boots. ? Your pant legs. ? Your sleeve cuffs.  For repellent products that contain permethrin, follow product instructions. Use these products on: ? Clothing. ? Gear. ? Boots. ? Tents.  Wear protective clothing. Long sleeves and long pants offer the best protection from ticks.  Wear light-colored clothing so you can see ticks more easily.  Tuck your pant legs into your socks.  If you go walking on a trail, stay in the middle of the trail so your skin, hair, and clothing do not touch the bushes.  Avoid walking through areas with long grass.  Check for ticks on your clothing, hair, and skin often while you are outside, and check again before you go inside. Make sure to check the places that ticks attach themselves most often. These places include the scalp, neck, armpits, waist, groin, and joint areas. Ticks that carry a disease called Lyme disease have to be attached to the skin for 24-48 hours. Checking for ticks every day will lessen your risk of this and other diseases.  When you come indoors, wash your clothes and take a shower or a bath right away. Dry your clothes in a  dryer on high heat for at least 60 minutes. This will kill any ticks in your clothes. What is the proper way to remove a tick? If you find a tick on your body, remove it as soon as possible. Removing a tick sooner rather than later can prevent germs from passing from the tick to your body. To remove a tick that is crawling on your skin but has not bitten:  Go outdoors and brush the tick off.  Remove the tick with tape or a lint roller. To remove a tick that is attached to your skin:  Wash your hands.  If you have latex gloves, put them on.  Use tweezers,  curved forceps, or a tick-removal tool to gently grasp the tick as close to your skin and the tick's head as possible.  Gently pull with steady, upward pressure until the tick lets go. When removing the tick: ? Take care to keep the tick's head attached to its body. ? Do not twist or jerk the tick. This can make the tick's head or mouth break off. ? Do not squeeze or crush the tick's body. This could force disease-carrying fluids from the tick into your body. Do not try to remove a tick with heat, alcohol, petroleum jelly, or fingernail polish. Using these methods can cause the tick to salivate and regurgitate into your bloodstream, increasing your risk of getting a disease. What should I do after removing a tick?  Clean the bite area with soap and water, rubbing alcohol, or an iodine scrub.  If an antiseptic cream or ointment is available, apply a small amount to the bite site.  Wash and disinfect any instruments that you used to remove the tick. How should I dispose of a tick? To dispose of a live tick, use one of these methods:  Place it in rubbing alcohol.  Place it in a sealed bag or container.  Wrap it tightly in tape.  Flush it down the toilet. Contact a health care provider if:  You have symptoms of a disease after a tick bite. Symptoms of a tick-borne disease can occur from moments after the tick bites to up to 30 days after a tick is removed. Symptoms include: ? Muscle, joint, or bone pain. ? Difficulty walking or moving your legs. ? Numbness in the legs. ? Paralysis. ? Red rash around the tick bite area that is shaped like a target or a "bull's-eye." ? Redness and swelling in the area of the tick bite. ? Fever. ? Repeated vomiting. ? Diarrhea. ? Weight loss. ? Tender, swollen lymph glands. ? Shortness of breath. ? Cough. ? Pain in the abdomen. ? Headache. ? Abnormal tiredness. ? A change in your level of consciousness. ? Confusion. Get help right away  if:  You are not able to remove a tick.  A part of a tick breaks off and gets stuck in your skin.  Your symptoms get worse. Summary  Ticks may carry germs that can spread to a person through a bite and cause disease.  Wear protective clothing and use insect repellent to prevent tick bites. Follow product instructions.  If you find a tick on your body, remove it as soon as possible. If the tick is attached, do not try to remove with heat, alcohol, petroleum jelly, or fingernail polish.  Remove the attached tick using tweezers, curved forceps, or a tick-removal tool. Gently pull with steady, upward pressure until the tick lets go. Do not twist or jerk the  tick. Do not squeeze or crush the tick's body.  If you have symptoms after being bitten by a tick, contact a health care provider. This information is not intended to replace advice given to you by your health care provider. Make sure you discuss any questions you have with your health care provider. Document Released: 11/26/2000 Document Revised: 11/11/2017 Document Reviewed: 09/10/2016 Elsevier Patient Education  2020 [employer].

## 2019-07-03 NOTE — Progress Notes (Signed)
Name: Jeffrey Yates   MRN: [id number]    DOB: [birth year]   Date:07/03/2019       Progress Note  Subjective  Chief Complaint  Chief Complaint  Patient presents with  . Insect Bite    Tick bite, noticed Sunday.    HPI  Patient noticed Sunday morning when he woke up he had a tick attached to him left upper thigh. Not sure how long it had been attached- head was deep inside- appeared like [place] dog tick. States he removed it with tweasers and pulled it but unsure if whole thing was removed. Started as small red area that has increased in size some. mildly itchy or painful.   Nausea, fatigue, fever, chills, myalgias, diarrhea, abdominal pain.   Hypertension There is no blood pressure medications listed in his chart, previously he was taking metoprolol but had stopped taking it at his last visit due to wanting to be off all medications.  He states he resumed it after discussion previously.  Patient is unsure of what dose he is taking but states his wife gives him his medications to take every day and he has not had any missed doses. Patient does have a low-salt diet. Denies chest pain, headaches, blurry vision, lightheadedness, dizziness.  Hyperlipidemia Patient did stop taking his cholesterol medication and does not want to restart this medication at this time.  He has eaten a heavy breakfast and lunch and will come back to get lipids at his routine follow-up. Lab Results  Component Value Date   CHOL 166 05/15/2018   HDL 69 05/15/2018   LDLCALC 72 05/15/2018   TRIG 170 (H) 05/15/2018   CHOLHDL 2.4 05/15/2018   Memory impairment Patient is seeing neurologist and [name] patient had rash with Aricept and namenda. Currently not on any medication. No safety concerns noted at this time.     PHQ2/9: Depression screen [facility] 2/9 07/03/2019 03/13/2019 05/15/2018 10/24/2017 08/10/2017  Decreased Interest 0 0 0 0 0  Down, Depressed, Hopeless 0 0 0 0 0  PHQ - 2 Score 0 0 0 0 0  Altered  sleeping 0 0 - - -  Tired, decreased energy 0 0 - - -  Change in appetite 0 0 - - -  Feeling bad or failure about yourself  0 0 - - -  Trouble concentrating 0 0 - - -  Moving slowly or fidgety/restless 0 0 - - -  Suicidal thoughts 0 0 - - -  PHQ-9 Score 0 0 - - -  Difficult doing work/chores Not difficult at all Not difficult at all - - -    PHQ reviewed. Negative  Patient Active Problem List   Diagnosis Date Noted  . Loss of memory 01/10/2019  . Gilbert's disease 05/17/2018  . Insomnia 05/15/2018  . Neuralgia neuritis, sciatic nerve 06/29/2016  . Gonalgia 06/29/2016  . Dupuytren's contracture 05/31/2016  . Hyperbilirubinemia 05/26/2016  . Snoring 05/26/2016  . Gout 09/22/2015  . Psoriasis 09/22/2015  . Hypertension 07/14/2015  . Hyperlipidemia 07/14/2015  . History of gout 07/14/2015  . AA (alcohol abuse) 07/14/2015  . Allergic rhinitis 07/14/2015  . Hypercholesteremia 07/14/2015  . Benign non-nodular prostatic hyperplasia without lower urinary tract symptoms 11/26/2014    Past Medical History:  Diagnosis Date  . Abnormal liver enzymes 06/29/2016  . Alcohol abuse 01/06/15   Siezure from Withdrawl - 11/26/14  . Allergy   . Gilbert's disease 05/17/2018   Seen by GI 2017  . Gout 09/22/2015  .  Hyperlipidemia   . Hypertension   . Neuralgia neuritis, sciatic nerve 06/29/2016  . Psoriasis 09/22/2015  . Seizure ([place]) 07/14/2015    Past Surgical History:  Procedure Laterality Date  . CHOLECYSTECTOMY  2007  . COLONOSCOPY W/ POLYPECTOMY  2019   cancerous polyp  . COLONOSCOPY WITH PROPOFOL N/A 09/08/2017   Procedure: COLONOSCOPY WITH PROPOFOL;  Surgeon: Lucilla Lame, MD;  Location: [place];  Service: Gastroenterology;  Laterality: N/A;  . FOOT SURGERY Right   . [place] SURGERY  2007  . POLYPECTOMY N/A 09/08/2017   Procedure: POLYPECTOMY;  Surgeon: Lucilla Lame, MD;  Location: [place];  Service: Gastroenterology;  Laterality: N/A;  . ROTATOR CUFF  REPAIR Left   . VASECTOMY      Social History   Tobacco Use  . Smoking status: Light Tobacco Smoker    Packs/day: 0.25  . Smokeless tobacco: Never Used  . Tobacco comment: 1 PPWeek, Smoked 10 yrs, quit for 17, restarted 1.5 yrs ago  Substance Use Topics  . Alcohol use: Yes    Alcohol/week: 3.0 standard drinks    Types: 3 Shots of liquor per week    Comment: martine 3x week     Current Outpatient Medications:  .  ascorbic acid (VITAMIN C) 1000 MG tablet, Take 1,000 mg by mouth daily., Disp: , Rfl:  .  Cholecalciferol (VITAMIN D3) 1000 UNITS CAPS, Take 1 capsule by mouth., Disp: , Rfl:  .  Omega-3 Fatty Acids (FISH OIL PO), Take 1 capsule by mouth daily. , Disp: , Rfl:  .  traZODone (DESYREL) 50 MG tablet, Take 1 tablet (50 mg total) by mouth at bedtime as needed for sleep., Disp: 30 tablet, Rfl: 2 .  vitamin B-12 (CYANOCOBALAMIN) 1000 MCG tablet, Take 1,000 mcg by mouth daily., Disp: , Rfl:   Allergies  Allergen Reactions  . Aricept [Donepezil Hcl] Rash  . Namenda [Memantine Hcl] Rash    ROS    No other specific complaints in a complete review of systems (except as listed in HPI above).  Objective  Vitals:   07/03/19 1335 07/03/19 1400  BP: (!) 164/102 (!) 164/82  Pulse: 72   Resp: 14   Temp: (!) 96.9 F (36.1 C)   TempSrc: Temporal   SpO2: 99%   Weight: 175 lb 6.4 oz (79.6 kg)   Height: 5' 10.5" (1.791 m)      Body mass index is 24.81 kg/m.  Nursing Note and Vital Signs reviewed.  Physical Exam  Physical Exam   Constitutional: Patient appears well-developed and well-nourished. Obese. No distress.  HEENT: head atraumatic, normocephalic, conjunctive clear Cardiovascular: Normal rate,  No BLE edema. Pulmonary/Chest: Effort normall. No respiratory distress. Skin: left upper thigh has red raised rash with central scab no diffuse redness or warmth, rash is blanchable.  Psychiatric: Patient has a normal mood and affect. behavior is normal. Judgment and  thought content normal.       No results found for this or any previous visit (from the past 48 hour(s)).  Assessment & Plan  1. Rash Daily claritin for one week, use hydrocortisone and neosporin cream for the first 3 days and then as needed  2. Tick bite of left lower leg, initial encounter Discussed doxy prophylaxis however patient identified tick as dog tick, additionally states tick was small and not engorged. Shared decision-making to hold off- will monitor   3. Uncontrolled hypertension Will go home and tell us what medications he is taking, will need to adjust meds  based of this. Follow-up in 2 weeks for BP. Written instructions provided for wife.   4. Loss of memory Follow-up with neurology   5. Hypercholesteremia Will recheck lipids at next visit.    -Red flags and when to present for emergency care or RTC including fever >101.37F, chest pain, shortness of breath, new/worsening/un-resolving symptoms,  reviewed with patient at time of visit. Follow up and care instructions discussed and provided in AVS.

## 2019-07-13 DIAGNOSIS — M720 Palmar fascial fibromatosis [Dupuytren]: Secondary | ICD-10-CM | POA: Diagnosis not present

## 2019-09-20 ENCOUNTER — Telehealth: Payer: Self-pay | Admitting: Family Medicine

## 2019-09-21 ENCOUNTER — Ambulatory Visit: Payer: Medicare HMO | Admitting: Family Medicine

## 2019-09-21 ENCOUNTER — Other Ambulatory Visit: Payer: Self-pay

## 2019-10-26 ENCOUNTER — Other Ambulatory Visit: Payer: Self-pay

## 2019-10-26 DIAGNOSIS — G4700 Insomnia, unspecified: Secondary | ICD-10-CM

## 2019-10-26 MED ORDER — TRAZODONE HCL 50 MG PO TABS: 50.0000 mg | ORAL_TABLET | Freq: Every evening | ORAL | 0 refills | Status: DC | PRN

## 2019-10-26 NOTE — Telephone Encounter (Signed)
Please schedule patient for follow up in the next 30 days.  

## 2019-10-29 NOTE — Telephone Encounter (Signed)
lvm for pt to schedule appt in the next 30days also informed that prescription has been sent to pharmacy

## 2019-11-13 ENCOUNTER — Ambulatory Visit: Payer: Medicare HMO | Admitting: Family Medicine

## 2019-11-15 ENCOUNTER — Ambulatory Visit: Payer: Medicare HMO | Admitting: Family Medicine

## 2019-11-20 DIAGNOSIS — D400 Neoplasm of uncertain behavior of prostate: Secondary | ICD-10-CM | POA: Diagnosis not present

## 2019-11-20 DIAGNOSIS — N401 Enlarged prostate with lower urinary tract symptoms: Secondary | ICD-10-CM | POA: Diagnosis not present

## 2019-11-22 ENCOUNTER — Ambulatory Visit (INDEPENDENT_AMBULATORY_CARE_PROVIDER_SITE_OTHER): Payer: Medicare HMO | Admitting: Family Medicine

## 2019-11-22 ENCOUNTER — Encounter: Payer: Self-pay | Admitting: Family Medicine

## 2019-11-22 ENCOUNTER — Other Ambulatory Visit: Payer: Self-pay

## 2019-11-22 VITALS — BP 140/78 | HR 77 | Temp 97.9°F | Resp 16 | Ht 71.0 in | Wt 179.4 lb

## 2019-11-22 DIAGNOSIS — E782 Mixed hyperlipidemia: Secondary | ICD-10-CM

## 2019-11-22 DIAGNOSIS — L409 Psoriasis, unspecified: Secondary | ICD-10-CM | POA: Diagnosis not present

## 2019-11-22 DIAGNOSIS — N400 Enlarged prostate without lower urinary tract symptoms: Secondary | ICD-10-CM

## 2019-11-22 DIAGNOSIS — Z87891 Personal history of nicotine dependence: Secondary | ICD-10-CM | POA: Diagnosis not present

## 2019-11-22 DIAGNOSIS — I10 Essential (primary) hypertension: Secondary | ICD-10-CM

## 2019-11-22 DIAGNOSIS — Z0000 Encounter for general adult medical examination without abnormal findings: Secondary | ICD-10-CM | POA: Diagnosis not present

## 2019-11-22 DIAGNOSIS — Z136 Encounter for screening for cardiovascular disorders: Secondary | ICD-10-CM | POA: Diagnosis not present

## 2019-11-22 MED ORDER — TRIAMCINOLONE ACETONIDE 0.1 % EX CREA: 1.0000 "application " | TOPICAL_CREAM | Freq: Two times a day (BID) | CUTANEOUS | 0 refills | Status: DC

## 2019-11-22 NOTE — Progress Notes (Signed)
Name: Jeffrey Yates   MRN: [id number]    DOB: Oct 29, 1951   Date:11/22/2019       Progress Note  Subjective  Chief Complaint  Chief Complaint  Patient presents with  . Annual Exam    HPI  Patient presents for annual CPE.  He is new to me today, former patient of Dr. Sanda Klein  USPSTF grade A and B recommendations:  Diet: Low fat, high fiber diet; not taking medication for cholesterol at this time Exercise: Walking some days - states needs to increase the number of times he walks in a week.  Depression: phq 9 is negative Depression screen [facility] 2/9 11/22/2019 07/03/2019 03/13/2019 05/15/2018 10/24/2017  Decreased Interest 0 0 0 0 0  Down, Depressed, Hopeless 0 0 0 0 0  PHQ - 2 Score 0 0 0 0 0  Altered sleeping 0 0 0 - -  Tired, decreased energy 0 0 0 - -  Change in appetite 0 0 0 - -  Feeling bad or failure about yourself  0 0 0 - -  Trouble concentrating 0 0 0 - -  Moving slowly or fidgety/restless 0 0 0 - -  Suicidal thoughts 0 0 0 - -  PHQ-9 Score 0 0 0 - -  Difficult doing work/chores Not difficult at all Not difficult at all Not difficult at all - -    Hypertension:  BP Readings from Last 3 Encounters:  11/22/19 140/78  07/03/19 (!) 164/82  05/15/18 126/84    Obesity: Wt Readings from Last 3 Encounters:  11/22/19 179 lb 6.4 oz (81.4 kg)  07/03/19 175 lb 6.4 oz (79.6 kg)  05/15/18 185 lb 12.8 oz (84.3 kg)   BMI Readings from Last 3 Encounters:  11/22/19 25.02 kg/m  07/03/19 24.81 kg/m  05/15/18 26.66 kg/m     Lipids:  Lab Results  Component Value Date   CHOL 166 05/15/2018   CHOL 151 02/14/2017   CHOL 157 05/19/2016   Lab Results  Component Value Date   HDL 69 05/15/2018   HDL 64 02/14/2017   HDL 63 05/19/2016   Lab Results  Component Value Date   LDLCALC 72 05/15/2018   LDLCALC 67 02/14/2017   LDLCALC 69 05/19/2016   Lab Results  Component Value Date   TRIG 170 (H) 05/15/2018   TRIG 101 02/14/2017   TRIG 126 05/19/2016   Lab Results   Component Value Date   CHOLHDL 2.4 05/15/2018   CHOLHDL 2.4 02/14/2017   CHOLHDL 2.5 05/19/2016   No results found for: LDLDIRECT Glucose:  Glucose, Bld  Date Value Ref Range Status  05/15/2018 94 65 - 139 mg/dL Final    Comment:    .        Non-fasting reference interval .   08/10/2017 79 65 - 99 mg/dL Final  02/14/2017 92 65 - 99 mg/dL Final      [facility] Visit from 11/22/2019 [facility]  AUDIT-C Score  1    Drinks about 3 days a week - has a couple of martinis.    Married STD testing and prevention (HIV/chl/gon/syphilis): Declines; no new partners Hep C: Negative 2018  Skin cancer: Does have psoriasis - current flare on the right leg.  He has not been on humira in a few years.  We will fill triamcinolone cream for him today, he will schedule follow up.  No lesions concerning for cancer at this time. Colorectal cancer: Denies family or personal history of colorectal cancer,  no changes in BM's - no blood in stool, dark and tarry stool, mucus in stool, or constipation/diarrhea. Prostate cancer: Seeing Dr. Rogers Blocker for BPH - taking dutasteride and tamsulosin.  Dr. Rogers Blocker did send request for PSA today whicih we will order and fax to him Lab Results  Component Value Date   PSA 0.6 08/10/2017   IPSS Questionnaire (AUA-7): Over the past month.   1)  How often have you had a sensation of not emptying your bladder completely after you finish urinating?  0 - Not at all  2)  How often have you had to urinate again less than two hours after you finished urinating? 0 - Not at all  3)  How often have you found you stopped and started again several times when you urinated?  0 - Not at all  4) How difficult have you found it to postpone urination?  0 - Not at all  5) How often have you had a weak urinary stream?  0 - Not at all  6) How often have you had to push or strain to begin urination?  0 - Not at all  7) How many times did you most typically get up to urinate  from the time you went to bed until the time you got up in the morning?  0 - None  Total score:  0-7 mildly symptomatic   8-19 moderately symptomatic   20-35 severely symptomatic   Lung cancer:  Smoking 1 cigarette/day for the last 5 years. Quit for 8 years, back smoking 5 years.  Was a 2 pack a week smoker  Low Dose CT Chest recommended if Age 16-80 years, 30 pack-year currently smoking OR have quit w/in 15years. Patient does not qualify.   AAA: Does qualify - will order today. The USPSTF recommends one-time screening with ultrasonography in men ages 83 to 87 years who have ever smoked ECG:  Denies chest pain, shortness of breath, or palpitation. EKG on file.  Advanced Care Planning: A voluntary discussion about advance care planning including the explanation and discussion of advance directives.  Discussed health care proxy and Living will, and the patient was able to identify a health care proxy as Wife Diannia Ruder).  Patient does have a living will at present time. If patient does have living will, I have requested they bring this to the clinic to be scanned in to their chart.  Patient Active Problem List   Diagnosis Date Noted  . Memory difficulties 01/10/2019  . Gilbert's disease 05/17/2018  . Insomnia 05/15/2018  . Neuralgia neuritis, sciatic nerve 06/29/2016  . Gonalgia 06/29/2016  . Dupuytren's contracture 05/31/2016  . Snoring 05/26/2016  . Gout 09/22/2015  . Psoriasis 09/22/2015  . Hypertension 07/14/2015  . Hyperlipidemia 07/14/2015  . AA (alcohol abuse) 07/14/2015  . Allergic rhinitis 07/14/2015  . Benign non-nodular prostatic hyperplasia without lower urinary tract symptoms 11/26/2014    Past Surgical History:  Procedure Laterality Date  . CHOLECYSTECTOMY  2007  . COLONOSCOPY W/ POLYPECTOMY  2019   cancerous polyp  . COLONOSCOPY WITH PROPOFOL N/A 09/08/2017   Procedure: COLONOSCOPY WITH PROPOFOL;  Surgeon: Lucilla Lame, MD;  Location: [place];  Service:  Gastroenterology;  Laterality: N/A;  . FOOT SURGERY Right   . [place] SURGERY  2007  . POLYPECTOMY N/A 09/08/2017   Procedure: POLYPECTOMY;  Surgeon: Lucilla Lame, MD;  Location: [place];  Service: Gastroenterology;  Laterality: N/A;  . ROTATOR CUFF REPAIR Left   . VASECTOMY  Family History  Problem Relation Age of Onset  . Alcohol abuse Paternal Grandfather     Social History   Socioeconomic History  . Marital status: Married    Spouse name: [place]  . Number of children: 3  . Years of education: Not on file  . Highest education level: Not on file  Occupational History  . Not on file  Tobacco Use  . Smoking status: Light Tobacco Smoker    Packs/day: 0.25  . Smokeless tobacco: Never Used  . Tobacco comment: 1 PPWeek, Smoked 10 yrs, quit for 17, restarted 1.5 yrs ago  Substance and Sexual Activity  . Alcohol use: Yes    Alcohol/week: 3.0 standard drinks    Types: 3 Shots of liquor per week    Comment: martine 3x week  . Drug use: No  . Sexual activity: Not Currently    Partners: Female  Other Topics Concern  . Not on file  Social History Narrative  . Not on file   Social Determinants of Health   Financial Resource Strain:   . Difficulty of Paying Living Expenses: Not on file  Food Insecurity:   . Worried About Charity fundraiser in the Last Year: Not on file  . Ran Out of Food in the Last Year: Not on file  Transportation Needs:   . Lack of Transportation (Medical): Not on file  . Lack of Transportation (Non-Medical): Not on file  Physical Activity:   . Days of Exercise per Week: Not on file  . Minutes of Exercise per Session: Not on file  Stress:   . Feeling of Stress : Not on file  Social Connections:   . Frequency of Communication with Friends and Family: Not on file  . Frequency of Social Gatherings with Friends and Family: Not on file  . Attends Religious Services: Not on file  . Active Member of Clubs or Organizations: Not on file  .  Attends Archivist Meetings: Not on file  . Marital Status: Not on file  Intimate Partner Violence:   . Fear of Current or Ex-Partner: Not on file  . Emotionally Abused: Not on file  . Physically Abused: Not on file  . Sexually Abused: Not on file     Current Outpatient Medications:  .  ascorbic acid (VITAMIN C) 1000 MG tablet, Take 1,000 mg by mouth daily., Disp: , Rfl:  .  Cholecalciferol (VITAMIN D3) 1000 UNITS CAPS, Take 1 capsule by mouth., Disp: , Rfl:  .  dutasteride (AVODART) 0.5 MG capsule, Take 0.5 mg by mouth daily., Disp: , Rfl:  .  Omega-3 Fatty Acids (FISH OIL PO), Take 1 capsule by mouth daily. , Disp: , Rfl:  .  tamsulosin (FLOMAX) 0.4 MG CAPS capsule, Take 0.4 mg by mouth daily., Disp: , Rfl:  .  traZODone (DESYREL) 50 MG tablet, Take 1 tablet (50 mg total) by mouth at bedtime as needed for sleep., Disp: 30 tablet, Rfl: 0 .  vitamin B-12 (CYANOCOBALAMIN) 1000 MCG tablet, Take 1,000 mcg by mouth daily., Disp: , Rfl:   Allergies  Allergen Reactions  . Aricept [Donepezil Hcl] Rash  . Namenda [Memantine Hcl] Rash     ROS  Constitutional: Negative for fever or weight change.  Respiratory: Negative for cough and shortness of breath.   Cardiovascular: Negative for chest pain or palpitations.  Gastrointestinal: Negative for abdominal pain, no bowel changes.  Musculoskeletal: Negative for gait problem or joint swelling.  Skin: Negative for rash.  Neurological: Negative  for dizziness or headache.  No other specific complaints in a complete review of systems (except as listed in HPI above).  Objective  Vitals:   11/22/19 1245  BP: 140/78  Pulse: 77  Resp: 16  Temp: 97.9 F (36.6 C)  TempSrc: Temporal  SpO2: 99%  Weight: 179 lb 6.4 oz (81.4 kg)  Height: 5\' 11"  (1.803 m)    Body mass index is 25.02 kg/m.  Physical Exam   Constitutional: Patient appears well-developed and well-nourished. No distress.  HENT: Head: Normocephalic and atraumatic.  Ears: B TMs ok, no erythema or effusion; Nose: Nose normal. Mouth/Throat: Oropharynx is clear and moist. No oropharyngeal exudate.  Eyes: Conjunctivae and EOM are normal. Pupils are equal, round, and reactive to light. No scleral icterus.  Neck: Normal range of motion. Neck supple. No JVD present. No thyromegaly present.  Cardiovascular: Normal rate, regular rhythm and normal heart sounds.  No murmur heard. No BLE edema. Pulmonary/Chest: Effort normal and breath sounds normal. No respiratory distress. Abdominal: Soft. Bowel sounds are normal, no distension. There is no tenderness. no masses MALE GENITALIA: Deferred RECTAL: Deferred Musculoskeletal: Normal range of motion, no joint effusions. No gross deformities Neurological: he is alert and oriented to person, place, and time. No cranial nerve deficit. Coordination, balance, strength, speech and gait are normal.  Skin: Skin is warm and dry. No rash noted. No erythema.  Psychiatric: Patient has a normal mood and affect. behavior is normal. Judgment and thought content normal.  No results found for this or any previous visit (from the past 2160 hour(s)).   PHQ2/9: Depression screen [facility] 2/9 11/22/2019 07/03/2019 03/13/2019 05/15/2018 10/24/2017  Decreased Interest 0 0 0 0 0  Down, Depressed, Hopeless 0 0 0 0 0  PHQ - 2 Score 0 0 0 0 0  Altered sleeping 0 0 0 - -  Tired, decreased energy 0 0 0 - -  Change in appetite 0 0 0 - -  Feeling bad or failure about yourself  0 0 0 - -  Trouble concentrating 0 0 0 - -  Moving slowly or fidgety/restless 0 0 0 - -  Suicidal thoughts 0 0 0 - -  PHQ-9 Score 0 0 0 - -  Difficult doing work/chores Not difficult at all Not difficult at all Not difficult at all - -   Fall Risk: Fall Risk  11/22/2019 07/03/2019 03/13/2019 05/15/2018 10/24/2017  Falls in the past year? 0 0 0 No No  Number falls in past yr: 0 0 - - -  Injury with Fall? 0 0 - - -  Comment - - - - -  Risk for fall due to : - - - - -  Risk for  fall due to: Comment - - - - -  Follow up Falls evaluation completed - - - -    Assessment & Plan  1. Annual physical exam -Prostate cancer screening and PSA options (with potential risks and benefits of testing vs not testing) were discussed along with recent recs/guidelines. -USPSTF grade A and B recommendations reviewed with patient; age-appropriate recommendations, preventive care, screening tests, etc discussed and encouraged; healthy living encouraged; see AVS for patient education given to patient -Discussed importance of 150 minutes of physical activity weekly, eat two servings of fish weekly, eat one serving of tree nuts ( cashews, pistachios, pecans, almonds.Marland Kitchen) every other day, eat 6 servings of fruit/vegetables daily and drink plenty of water and avoid sweet beverages.  - [place] MEDICARE SCREENING; Future  2.  Essential hypertension - COMPLETE METABOLIC PANEL WITH GFR  3. Mixed hyperlipidemia - Lipid panel  4. Gilbert's disease - COMPLETE METABOLIC PANEL WITH GFR  5. Benign non-nodular prostatic hyperplasia without lower urinary tract symptoms - PSA  6. Psoriasis - triamcinolone cream (KENALOG) 0.1 %; Apply 1 application topically 2 (two) times daily.  Dispense: 30 g; Refill: 0  7. Encounter for screening for abdominal aortic aneurysm (AAA) in patient 44 years of age or older with history of smoking - [place] MEDICARE SCREENING; Future

## 2019-11-23 DIAGNOSIS — I10 Essential (primary) hypertension: Secondary | ICD-10-CM | POA: Diagnosis not present

## 2019-11-23 DIAGNOSIS — N400 Enlarged prostate without lower urinary tract symptoms: Secondary | ICD-10-CM | POA: Diagnosis not present

## 2019-11-23 DIAGNOSIS — E782 Mixed hyperlipidemia: Secondary | ICD-10-CM | POA: Diagnosis not present

## 2019-11-24 LAB — PSA: PSA: 0.4 ng/mL (ref <=4.0)

## 2019-11-24 LAB — LIPID PANEL
Cholesterol: 137 mg/dL (ref <200)
HDL: 69 mg/dL (ref >=40)
LDL Cholesterol (Calc): 54 mg/dL (calc)
Non-HDL Cholesterol (Calc): 68 mg/dL (calc) (ref <130)
Total CHOL/HDL Ratio: 2 (calc) (ref <5.0)
Triglycerides: 49 mg/dL (ref <150)

## 2019-11-24 LAB — COMPLETE METABOLIC PANEL WITH GFR
AG Ratio: 1.9 (calc) (ref 1.0–2.5)
ALT: 45 U/L (ref 9–46)
AST: 44 U/L — ABNORMAL HIGH (ref 10–35)
Albumin: 4.1 g/dL (ref 3.6–5.1)
Alkaline phosphatase (APISO): 68 U/L (ref 35–144)
BUN: 22 mg/dL (ref 7–25)
CO2: 21 mmol/L (ref 20–32)
Calcium: 9.2 mg/dL (ref 8.6–10.3)
Chloride: 107 mmol/L (ref 98–110)
Creat: 0.81 mg/dL (ref 0.70–1.25)
GFR, Est African American: 106 mL/min/{1.73_m2} (ref >=60)
GFR, Est Non African American: 91 mL/min/{1.73_m2} (ref >=60)
Globulin: 2.2 g/dL (calc) (ref 1.9–3.7)
Glucose, Bld: 106 mg/dL — ABNORMAL HIGH (ref 65–99)
Potassium: 4.4 mmol/L (ref 3.5–5.3)
Sodium: 143 mmol/L (ref 135–146)
Total Bilirubin: 0.7 mg/dL (ref 0.2–1.2)
Total Protein: 6.3 g/dL (ref 6.1–8.1)

## 2019-11-25 ENCOUNTER — Other Ambulatory Visit: Payer: Self-pay | Admitting: Family Medicine

## 2019-11-25 DIAGNOSIS — G4700 Insomnia, unspecified: Secondary | ICD-10-CM

## 2019-12-01 ENCOUNTER — Other Ambulatory Visit: Payer: Self-pay | Admitting: Family Medicine

## 2019-12-01 DIAGNOSIS — I10 Essential (primary) hypertension: Secondary | ICD-10-CM

## 2019-12-21 DIAGNOSIS — M720 Palmar fascial fibromatosis [Dupuytren]: Secondary | ICD-10-CM | POA: Diagnosis not present

## 2019-12-24 ENCOUNTER — Telehealth: Payer: Self-pay | Admitting: Family Medicine

## 2019-12-24 NOTE — Telephone Encounter (Signed)
Pt requesting return call. He had requested a refill on metoprolol and it was denied. Stated that he was seen in December for CPE. Please advise

## 2019-12-25 ENCOUNTER — Other Ambulatory Visit: Payer: Self-pay | Admitting: Emergency Medicine

## 2019-12-25 DIAGNOSIS — I10 Essential (primary) hypertension: Secondary | ICD-10-CM

## 2019-12-25 MED ORDER — METOPROLOL SUCCINATE ER 100 MG PO TB24: 100.0000 mg | ORAL_TABLET | Freq: Every day | ORAL | 1 refills | Status: DC

## 2019-12-26 NOTE — Telephone Encounter (Signed)
Pend note to [facility] for Metoprolol

## 2020-02-28 ENCOUNTER — Ambulatory Visit: Payer: Medicare HMO

## 2020-03-11 ENCOUNTER — Ambulatory Visit (INDEPENDENT_AMBULATORY_CARE_PROVIDER_SITE_OTHER): Payer: Medicare HMO

## 2020-03-11 VITALS — Ht 71.0 in | Wt 180.0 lb

## 2020-03-11 DIAGNOSIS — Z0000 Encounter for general adult medical examination without abnormal findings: Secondary | ICD-10-CM

## 2020-03-11 NOTE — Progress Notes (Signed)
Subjective:   Jeffrey Yates is a 69 y.o. male who presents for an Initial Medicare Annual Wellness Visit.  Virtual Visit via Telephone Note  I connected with [name] on 03/11/20 at  8:00 AM EDT by telephone and verified that I am speaking with the correct person using two identifiers.  Medicare Annual Wellness visit completed telephonically due to Covid-19 pandemic.   Location: Patient: home Provider: office   I discussed the limitations, risks, security and privacy concerns of performing an evaluation and management service by telephone and the availability of in person appointments. The patient expressed understanding and agreed to proceed.  Some vital signs may be absent or patient reported.   Clemetine Marker, LPN    Review of Systems   Cardiac Risk Factors include: advanced age (>26men, >32 women);male gender;hypertension    Objective:    Today's Vitals   03/11/20 0824  Weight: 180 lb (81.6 kg)  Height: 5\' 11"  (1.803 m)   Body mass index is 25.1 kg/m.  Advanced Directives 03/11/2020 09/08/2017 08/10/2017 07/04/2017 06/06/2017 06/04/2017 02/14/2017  Does Patient Have a Medical Advance Directive? Yes Yes Yes Yes Yes Yes No  Type of Paramedic of [place];Living will [place];Living will Living will - - Out of facility DNR (pink MOST or yellow form) -  Does patient want to make changes to medical advance directive? - - - - - - -  Copy of [place] in Chart? No - copy requested No - copy requested - - - - -  Would patient like information on creating a medical advance directive? - - - - - - -    Current Medications (verified) Outpatient Encounter Medications as of 03/11/2020  Medication Sig  . ascorbic acid (VITAMIN C) 1000 MG tablet Take 1,000 mg by mouth daily.  . Cholecalciferol (VITAMIN D3) 1000 UNITS CAPS Take 1 capsule by mouth.  . dutasteride (AVODART) 0.5 MG capsule Take 0.5 mg by mouth daily.  .  metoprolol succinate (TOPROL-XL) 100 MG 24 hr tablet Take 1 tablet (100 mg total) by mouth daily. Take with or immediately following a meal.  . Omega-3 Fatty Acids (FISH OIL PO) Take 1 capsule by mouth daily.   . tamsulosin (FLOMAX) 0.4 MG CAPS capsule Take 0.4 mg by mouth daily.  . traZODone (DESYREL) 50 MG tablet TAKE 1 TABLET(50 MG) BY MOUTH AT BEDTIME AS NEEDED FOR SLEEP  . vitamin B-12 (CYANOCOBALAMIN) 1000 MCG tablet Take 1,000 mcg by mouth daily.  . [DISCONTINUED] triamcinolone cream (KENALOG) 0.1 % Apply 1 application topically 2 (two) times daily.   No facility-administered encounter medications on file as of 03/11/2020.    Allergies (verified) Aricept [donepezil hcl] and Namenda [memantine hcl]   History: Past Medical History:  Diagnosis Date  . Abnormal liver enzymes 06/29/2016  . Alcohol abuse 01/06/15   Siezure from Withdrawl - 11/26/14  . Allergy   . Gilbert's disease 05/17/2018   Seen by GI 2017  . Gout 09/22/2015  . Hyperlipidemia   . Hypertension   . Neuralgia neuritis, sciatic nerve 06/29/2016  . Psoriasis 09/22/2015  . Seizure ([place]) 07/14/2015   Past Surgical History:  Procedure Laterality Date  . CHOLECYSTECTOMY  2007  . COLONOSCOPY W/ POLYPECTOMY  2019   cancerous polyp  . COLONOSCOPY WITH PROPOFOL N/A 09/08/2017   Procedure: COLONOSCOPY WITH PROPOFOL;  Surgeon: Lucilla Lame, MD;  Location: [facility];  Service: Gastroenterology;  Laterality: N/A;  . FOOT SURGERY Right   .  [place] SURGERY  2007  . POLYPECTOMY N/A 09/08/2017   Procedure: POLYPECTOMY;  Surgeon: Lucilla Lame, MD;  Location: [facility];  Service: Gastroenterology;  Laterality: N/A;  . ROTATOR CUFF REPAIR Left   . VASECTOMY     Family History  Problem Relation Age of Onset  . Alcohol abuse Paternal Grandfather    Social History   Socioeconomic History  . Marital status: Married    Spouse name: [place]  . Number of children: 3  . Years of education: Not on file  . Highest  education level: Not on file  Occupational History  . Not on file  Tobacco Use  . Smoking status: Light Tobacco Smoker    Packs/day: 0.25    Years: 35.00    Pack years: 8.75  . Smokeless tobacco: Never Used  . Tobacco comment: 1 PPWeek, Smoked 10 yrs, quit for 17, restarted 1.5 yrs ago. 1-2 cigarettes per day as of 03/11/20  Substance and Sexual Activity  . Alcohol use: Yes    Alcohol/week: 3.0 standard drinks    Types: 3 Shots of liquor per week    Comment: martine 3x week  . Drug use: No  . Sexual activity: Not Currently    Partners: Female  Other Topics Concern  . Not on file  Social History Narrative  . Not on file   Social Determinants of Health   Financial Resource Strain: Low Risk   . Difficulty of Paying Living Expenses: Not hard at all  Food Insecurity: No Food Insecurity  . Worried About Charity fundraiser in the Last Year: Never true  . Ran Out of Food in the Last Year: Never true  Transportation Needs: No Transportation Needs  . Lack of Transportation (Medical): No  . Lack of Transportation (Non-Medical): No  Physical Activity: Sufficiently Active  . Days of Exercise per Week: 4 days  . Minutes of Exercise per Session: 60 min  Stress: No Stress Concern Present  . Feeling of Stress : Not at all  Social Connections: Somewhat Isolated  . Frequency of Communication with Friends and Family: More than three times a week  . Frequency of Social Gatherings with Friends and Family: Once a week  . Attends Religious Services: Never  . Active Member of Clubs or Organizations: No  . Attends Archivist Meetings: Never  . Marital Status: Married   Tobacco Counseling Ready to quit: Not Answered Counseling given: Not Answered Comment: 1 PPWeek, Smoked 10 yrs, quit for 17, restarted 1.5 yrs ago. 1-2 cigarettes per day as of 03/11/20   Clinical Intake:  Pre-visit preparation completed: Yes  Pain : No/denies pain     BMI - recorded: 25.1 Nutritional  Status: BMI 25 -29 Overweight Nutritional Risks: None Diabetes: No  How often do you need to have someone help you when you read instructions, pamphlets, or other written materials from your doctor or pharmacy?: 1 - Never  Interpreter Needed?: No  Information entered by :: Clemetine Marker LPN  Activities of Daily Living In your present state of health, do you have any difficulty performing the following activities: 03/11/2020 07/03/2019  Hearing? N N  Comment declines hearing aids -  Vision? N N  Difficulty concentrating or making decisions? Y N  Walking or climbing stairs? N N  Dressing or bathing? N N  Doing errands, shopping? N N  Preparing Food and eating ? N -  Using the Toilet? N -  In the past six months, have you  accidently leaked urine? N -  Do you have problems with loss of bowel control? N -  Managing your Medications? N -  Managing your Finances? N -  Housekeeping or managing your Housekeeping? N -  Some recent data might be hidden     Immunizations and Health Maintenance Immunization History  Administered Date(s) Administered  . Influenza, High Dose Seasonal PF 08/10/2017  . Influenza,inj,Quad PF,6+ Mos 11/26/2014, 10/20/2015  . Influenza-Unspecified 10/05/2016  . Pneumococcal Conjugate-13 10/12/2016, 07/06/2017  . Pneumococcal Polysaccharide-23 11/26/2014  . Tdap 07/06/2017  . Zoster Recombinat (Shingrix) 07/06/2017   Health Maintenance Due  Topic Date Due  . PNA vac Low Risk Adult (2 of 2 - PPSV23) 11/27/2019    Patient Care Team: Hubbard Hartshorn, FNP as PCP - General (Family Medicine)  Indicate any recent Medical Services you may have received from other than [facility] providers in the past year (date may be approximate).    Assessment:   This is a routine wellness examination for [place].  Hearing/Vision screen  Hearing Screening   125Hz  250Hz  500Hz  1000Hz  2000Hz  3000Hz  4000Hz  6000Hz  8000Hz   Right ear:           Left ear:           Comments: Pt denies  hearing difficulty  Vision Screening Comments: Past due for eye exam. Pt with Dr. Ellin Mayhew  Dietary issues and exercise activities discussed: Current Exercise Habits: Home exercise routine, Type of exercise: walking, Time (Minutes): 60, Frequency (Times/Week): 4, Weekly Exercise (Minutes/Week): 240, Intensity: Moderate, Exercise limited by: None identified  Goals   None    Depression Screen PHQ 2/9 Scores 03/11/2020 11/22/2019 07/03/2019 03/13/2019  PHQ - 2 Score 0 0 0 0  PHQ- 9 Score - 0 0 0    Fall Risk Fall Risk  03/11/2020 11/22/2019 07/03/2019 03/13/2019 05/15/2018  Falls in the past year? 0 0 0 0 No  Number falls in past yr: 0 0 0 - -  Injury with Fall? 0 0 0 - -  Comment - - - - -  Risk for fall due to : No Fall Risks - - - -  Risk for fall due to: Comment - - - - -  Follow up Falls prevention discussed Falls evaluation completed - - -    FALL RISK PREVENTION PERTAINING TO THE HOME:  Any stairs in or around the home? Yes  If so, do they handrails? Yes   Home free of loose throw rugs in walkways, pet beds, electrical cords, etc? Yes  Adequate lighting in your home to reduce risk of falls? Yes   ASSISTIVE DEVICES UTILIZED TO PREVENT FALLS:  Life alert? No  Use of a cane, walker or w/c? No  Grab bars in the bathroom? Yes  Shower chair or bench in shower? Yes  Elevated toilet seat or a handicapped toilet? No   DME ORDERS:  DME order needed?  No   TIMED UP AND GO:  Was the test performed? No .    Education: Fall risk prevention has been discussed.  Intervention(s) required? No   Cognitive Function:     6CIT Screen 03/11/2020 05/15/2018  What Year? 0 points 0 points  What month? 0 points 0 points  What time? 0 points 0 points  Count back from 20 0 points 0 points  Months in reverse 0 points 0 points  Repeat phrase 8 points 2 points  Total Score 8 2    Screening Tests Health Maintenance  Topic Date Due  .  PNA vac Low Risk Adult (2 of 2 - PPSV23) 11/27/2019    . INFLUENZA VACCINE  03/12/2020 (Originally 07/14/2019)  . COLONOSCOPY  09/08/2022  . TETANUS/TDAP  07/07/2027  . Hepatitis C Screening  Completed    Qualifies for Shingles Vaccine? Yes  . Due for second dose of Shingrix.   Tdap: Up to date  Flu Vaccine: Due for Flu vaccine. Does the patient want to receive this vaccine today?  No . Education has been provided regarding the importance of this vaccine but still declined. Advised may receive this vaccine at local pharmacy or Health Dept. Aware to provide a copy of the vaccination record if obtained from local pharmacy or Health Dept. Verbalized acceptance and understanding.  Pneumococcal Vaccine: Up to date    Cancer Screenings:  Colorectal Screening: Completed 09/08/17. Repeat every 5 years  Lung Cancer Screening: (Low Dose CT Chest recommended if Age 59-80 years, 30 pack-year currently smoking OR have quit w/in 15years.) does not qualify.   Additional Screening:  Hepatitis C Screening: does qualify; Completed 08/10/17  Vision Screening: Recommended annual ophthalmology exams for early detection of glaucoma and other disorders of the eye. Is the patient up to date with their annual eye exam?  No  postponed Who is the provider or what is the name of the office in which the pt attends annual eye exams? [place] Screening: Recommended annual dental exams for proper oral hygiene  Community Resource Referral:  CRR required this visit?  No       Plan:    I have personally reviewed and addressed the Medicare Annual Wellness questionnaire and have noted the following in the patient's chart:  A. Medical and social history B. Use of alcohol, tobacco or illicit drugs  C. Current medications and supplements D. Functional ability and status E.  Nutritional status F.  Physical activity G. Advance directives H. List of other physicians I.  Hospitalizations, surgeries, and ER visits in previous 12 months J.   [place] such as hearing and vision if needed, cognitive and depression L. Referrals and appointments   In addition, I have reviewed and discussed with patient certain preventive protocols, quality metrics, and best practice recommendations. A written personalized care plan for preventive services as well as general preventive health recommendations were provided to patient.   Signed,  Clemetine Marker, LPN Nurse Health Advisor   Nurse Notes: pt states concern about memory but says he uses a notepad and writes things down to help keep track of things to do. This has been an ongoing issue as documented in previous visits. He is otherwise doing well and reports feeling good. Pt plans to get Covid vaccine soon.

## 2020-03-11 NOTE — Patient Instructions (Signed)
Jeffrey Yates , Thank you for taking time to come for your Medicare Wellness Visit. I appreciate your ongoing commitment to your health goals. Please review the following plan we discussed and let me know if I can assist you in the future.   Screening recommendations/referrals: Colonoscopy: done 09/08/17. Repeat in 2023.  Recommended yearly ophthalmology/optometry visit for glaucoma screening and checkup Recommended yearly dental visit for hygiene and checkup  Vaccinations: Influenza vaccine: postponed Pneumococcal vaccine: done 07/06/17 Tdap vaccine: done 07/06/17 Shingles vaccine: Shingrix discussed. Please contact your pharmacy for coverage information.   Advanced directives: Please bring a copy of your health care power of attorney and living will to the office at your convenience.  Conditions/risks identified: Keep up the great work!  Next appointment: Please follow up in one year for your Medicare Annual Wellness visit.    Preventive Care 1 Years and Older, Male Preventive care refers to lifestyle choices and visits with your health care provider that can promote health and wellness. What does preventive care include?  A yearly physical exam. This is also called an annual well check.  Dental exams once or twice a year.  Routine eye exams. Ask your health care provider how often you should have your eyes checked.  Personal lifestyle choices, including:  Daily care of your teeth and gums.  Regular physical activity.  Eating a healthy diet.  Avoiding tobacco and drug use.  Limiting alcohol use.  Practicing safe sex.  Taking low doses of aspirin every day.  Taking vitamin and mineral supplements as recommended by your health care provider. What happens during an annual well check? The services and screenings done by your health care provider during your annual well check will depend on your age, overall health, lifestyle risk factors, and family history of disease.  Counseling  Your health care provider may ask you questions about your:  Alcohol use.  Tobacco use.  Drug use.  Emotional well-being.  Home and relationship well-being.  Sexual activity.  Eating habits.  History of falls.  Memory and ability to understand (cognition).  Work and work Statistician. Screening  You may have the following tests or measurements:  Height, weight, and BMI.  Blood pressure.  Lipid and cholesterol levels. These may be checked every 5 years, or more frequently if you are over 102 years old.  Skin check.  Lung cancer screening. You may have this screening every year starting at age 83 if you have a 30-pack-year history of smoking and currently smoke or have quit within the past 15 years.  Fecal occult blood test (FOBT) of the stool. You may have this test every year starting at age 26.  Flexible sigmoidoscopy or colonoscopy. You may have a sigmoidoscopy every 5 years or a colonoscopy every 10 years starting at age 31.  Prostate cancer screening. Recommendations will vary depending on your family history and other risks.  Hepatitis C blood test.  Hepatitis B blood test.  Sexually transmitted disease (STD) testing.  Diabetes screening. This is done by checking your blood sugar (glucose) after you have not eaten for a while (fasting). You may have this done every 1-3 years.  Abdominal aortic aneurysm (AAA) screening. You may need this if you are a current or former smoker.  Osteoporosis. You may be screened starting at age 17 if you are at high risk. Talk with your health care provider about your test results, treatment options, and if necessary, the need for more tests. Vaccines  Your health care provider  may recommend certain vaccines, such as:  Influenza vaccine. This is recommended every year.  Tetanus, diphtheria, and acellular pertussis (Tdap, Td) vaccine. You may need a Td booster every 10 years.  Zoster vaccine. You may need this  after age 4.  Pneumococcal 13-valent conjugate (PCV13) vaccine. One dose is recommended after age 72.  Pneumococcal polysaccharide (PPSV23) vaccine. One dose is recommended after age 24. Talk to your health care provider about which screenings and vaccines you need and how often you need them. This information is not intended to replace advice given to you by your health care provider. Make sure you discuss any questions you have with your health care provider. Document Released: 12/26/2015 Document Revised: 08/18/2016 Document Reviewed: 09/30/2015 Elsevier Interactive Patient Education  2017 [place] Prevention in the Home Falls can cause injuries. They can happen to people of all ages. There are many things you can do to make your home safe and to help prevent falls. What can I do on the outside of my home?  Regularly fix the edges of walkways and driveways and fix any cracks.  Remove anything that might make you trip as you walk through a door, such as a raised step or threshold.  Trim any bushes or trees on the path to your home.  Use bright outdoor lighting.  Clear any walking paths of anything that might make someone trip, such as rocks or tools.  Regularly check to see if handrails are loose or broken. Make sure that both sides of any steps have handrails.  Any raised decks and porches should have guardrails on the edges.  Have any leaves, snow, or ice cleared regularly.  Use sand or salt on walking paths during winter.  Clean up any spills in your garage right away. This includes oil or grease spills. What can I do in the bathroom?  Use night lights.  Install grab bars by the toilet and in the tub and shower. Do not use towel bars as grab bars.  Use non-skid mats or decals in the tub or shower.  If you need to sit down in the shower, use a plastic, non-slip stool.  Keep the floor dry. Clean up any water that spills on the floor as soon as it happens.   Remove soap buildup in the tub or shower regularly.  Attach bath mats securely with double-sided non-slip rug tape.  Do not have throw rugs and other things on the floor that can make you trip. What can I do in the bedroom?  Use night lights.  Make sure that you have a light by your bed that is easy to reach.  Do not use any sheets or blankets that are too big for your bed. They should not hang down onto the floor.  Have a firm chair that has side arms. You can use this for support while you get dressed.  Do not have throw rugs and other things on the floor that can make you trip. What can I do in the kitchen?  Clean up any spills right away.  Avoid walking on wet floors.  Keep items that you use a lot in easy-to-reach places.  If you need to reach something above you, use a strong step stool that has a grab bar.  Keep electrical cords out of the way.  Do not use floor polish or wax that makes floors slippery. If you must use wax, use non-skid floor wax.  Do not have throw rugs  and other things on the floor that can make you trip. What can I do with my stairs?  Do not leave any items on the stairs.  Make sure that there are handrails on both sides of the stairs and use them. Fix handrails that are broken or loose. Make sure that handrails are as long as the stairways.  Check any carpeting to make sure that it is firmly attached to the stairs. Fix any carpet that is loose or worn.  Avoid having throw rugs at the top or bottom of the stairs. If you do have throw rugs, attach them to the floor with carpet tape.  Make sure that you have a light switch at the top of the stairs and the bottom of the stairs. If you do not have them, ask someone to add them for you. What else can I do to help prevent falls?  Wear shoes that:  Do not have high heels.  Have rubber bottoms.  Are comfortable and fit you well.  Are closed at the toe. Do not wear sandals.  If you use a  stepladder:  Make sure that it is fully opened. Do not climb a closed stepladder.  Make sure that both sides of the stepladder are locked into place.  Ask someone to hold it for you, if possible.  Clearly mark and make sure that you can see:  Any grab bars or handrails.  First and last steps.  Where the edge of each step is.  Use tools that help you move around (mobility aids) if they are needed. These include:  Canes.  Walkers.  Scooters.  Crutches.  Turn on the lights when you go into a dark area. Replace any light bulbs as soon as they burn out.  Set up your furniture so you have a clear path. Avoid moving your furniture around.  If any of your floors are uneven, fix them.  If there are any pets around you, be aware of where they are.  Review your medicines with your doctor. Some medicines can make you feel dizzy. This can increase your chance of falling. Ask your doctor what other things that you can do to help prevent falls. This information is not intended to replace advice given to you by your health care provider. Make sure you discuss any questions you have with your health care provider. Document Released: 09/25/2009 Document Revised: 05/06/2016 Document Reviewed: 01/03/2015 Elsevier Interactive Patient Education  2017 [employer].

## 2020-04-19 ENCOUNTER — Ambulatory Visit: Payer: Medicare HMO | Attending: Internal Medicine

## 2020-04-19 DIAGNOSIS — Z23 Encounter for immunization: Secondary | ICD-10-CM

## 2020-04-19 NOTE — Progress Notes (Signed)
   Covid-19 Vaccination Clinic  Name:  Jeffrey Yates    MRN: [id number] DOB: [birth year]  04/19/2020  [name] was observed post Covid-19 immunization for 15 minutes without incident. He was provided with Vaccine Information Sheet and instruction to access the V-Safe system.   [name] was instructed to call 911 with any severe reactions post vaccine: Marland Kitchen Difficulty breathing  . Swelling of face and throat  . A fast heartbeat  . A bad rash all over body  . Dizziness and weakness   Immunizations Administered    Name Date Dose VIS Date Route   Pfizer COVID-19 Vaccine 04/19/2020  9:58 AM 0.3 mL 02/06/2019 Intramuscular   Manufacturer: [place]   Lot: [id number]   [place]: [id number]

## 2020-04-23 ENCOUNTER — Other Ambulatory Visit: Payer: Self-pay | Admitting: Family Medicine

## 2020-04-23 DIAGNOSIS — G4700 Insomnia, unspecified: Secondary | ICD-10-CM

## 2020-05-13 ENCOUNTER — Ambulatory Visit: Payer: Medicare HMO | Attending: Internal Medicine

## 2020-05-13 DIAGNOSIS — Z23 Encounter for immunization: Secondary | ICD-10-CM

## 2020-05-13 NOTE — Progress Notes (Signed)
   Covid-19 Vaccination Clinic  Name:  Jeffrey Yates    MRN: [id number] DOB: 1951-08-16  05/13/2020  Mr. Defer was observed post Covid-19 immunization for 15 minutes without incident. He was provided with Vaccine Information Sheet and instruction to access the V-Safe system.   [name] was instructed to call 911 with any severe reactions post vaccine: Marland Kitchen Difficulty breathing  . Swelling of face and throat  . A fast heartbeat  . A bad rash all over body  . Dizziness and weakness   Immunizations Administered    Name Date Dose VIS Date Route   Pfizer COVID-19 Vaccine 05/13/2020 10:10 AM 0.3 mL 02/06/2019 Intramuscular   Manufacturer: [place]   Lot: [id number]   [place]: [id number]

## 2020-07-17 ENCOUNTER — Telehealth: Payer: Self-pay

## 2020-07-17 NOTE — Telephone Encounter (Signed)
Pt needs appt for refills

## 2020-07-18 NOTE — Telephone Encounter (Signed)
Pt states he is not in need of any refills at this time

## 2020-07-22 NOTE — Progress Notes (Addendum)
Patient ID: Jeffrey Yates, male    DOB: 06/20/1951, 69 y.o.   MRN: [id number]  PCP: Hubbard Hartshorn, FNP  Chief Complaint  Patient presents with  . Follow-up  . Psoriasis    Subjective:   Jeffrey Yates is a 69 y.o. male, presents to clinic with CC of the following:  Chief Complaint  Patient presents with  . Follow-up  . Psoriasis    HPI:  Patient is a 69 year old male patient of Raelyn Ensign Last seen by her in December 2020 for an annual physical. Labs done at that time and results reviewed Follows up today, states needs medication refill When asked what medication he needs refilled, he states he does not know what medications he is taking, and that his wife with him and a little container for him to take. He states he takes three medications regularly, and the trazodone product as needed   Hypertension Current medication regimen-Toprol-XL-100 mg daily Taking medication regularly Does not check blood pressures at home BP Readings from Last 3 Encounters:  07/23/20 124/76  11/22/19 140/78  07/03/19 (!) 164/82    Denies chest pain, palpitations, shortness of breath, increased lower extremity swelling, increased headaches, vision changes  Mixed hyperlipidemia Medication regimen-fish oil, Not sure if taking atorvastatin Lab Results  Component Value Date   CHOL 137 11/23/2019   HDL 69 11/23/2019   LDLCALC 54 11/23/2019   TRIG 49 11/23/2019   CHOLHDL 2.0 11/23/2019   Denies myalgia  Dupuytren's contractures -bilateral, and was seen at [facility], in early January with surgery recommended.  On a phone message later that month, it was noted that patient decided to delay surgery.  Benign non-nodular prostatic hyperplasia without lower urinary tract symptoms/prostate cancer screening Lab Results  Component Value Date   PSA 0.4 11/23/2019   PSA 0.6 08/10/2017  Not taking avodart, thinks uses flomax and helps Not up at night to urinate, no hesitancy,  urgency, hematuria  Tubular adenoma-on colonoscopy in September 2018, f/u usually in 3-5 years after.  If was 3 years after, would be due next month. Felt best to have him call the GI doctor and ask what the recommendation is for follow-up.  Psoriasis Uses gold bond cream to help, uses 4X/day to manage presently  legs seem to be getting better, still in elbows and buttock Discussed potential steroid cream to help, predominantly when has flares, and he noted he would like to try that. He thinks he is managing it fairly well in the recent past.  Insomnia Takes trazodone-50 mg prn, maybe two times a week is needed presently  memory deficit Was followed by neurology, was noted on namenda in the past, not taking any more and no longer sees neurology.  Also see that [place] P[place] is in his allergy list.  No longer use alcohol and thinks that has been helpful (not had drink in two months).  Walks over a mile 3-4 times a week presently   Patient Active Problem List   Diagnosis Date Noted  . Increased anion gap metabolic acidosis 21/19/4174  . Memory difficulties 01/10/2019  . Gilbert's disease 05/17/2018  . Insomnia 05/15/2018  . Neuralgia neuritis, sciatic nerve 06/29/2016  . Gonalgia 06/29/2016  . Dupuytren's contracture 05/31/2016  . Snoring 05/26/2016  . Gout 09/22/2015  . Psoriasis 09/22/2015  . Hypertension 07/14/2015  . Hyperlipidemia 07/14/2015  . AA (alcohol abuse) 07/14/2015  . Allergic rhinitis 07/14/2015  . Benign non-nodular prostatic hyperplasia without lower urinary tract symptoms  11/26/2014      Current Outpatient Medications:  .  ascorbic acid (VITAMIN C) 1000 MG tablet, Take 1,000 mg by mouth daily., Disp: , Rfl:  .  Cholecalciferol (VITAMIN D3) 1000 UNITS CAPS, Take 1 capsule by mouth., Disp: , Rfl:  .  dutasteride (AVODART) 0.5 MG capsule, Take 0.5 mg by mouth daily., Disp: , Rfl:  .  metoprolol succinate (TOPROL-XL) 100 MG 24 hr tablet, Take 1 tablet (100 mg total)  by mouth daily. Take with or immediately following a meal., Disp: 90 tablet, Rfl: 1 .  Omega-3 Fatty Acids (FISH OIL PO), Take 1 capsule by mouth daily. , Disp: , Rfl:  .  tamsulosin (FLOMAX) 0.4 MG CAPS capsule, Take 0.4 mg by mouth daily., Disp: , Rfl:  .  traZODone (DESYREL) 50 MG tablet, TAKE 1 TABLET(50 MG) BY MOUTH AT BEDTIME AS NEEDED FOR SLEEP, Disp: 30 tablet, Rfl: 3 .  vitamin B-12 (CYANOCOBALAMIN) 1000 MCG tablet, Take 1,000 mcg by mouth daily., Disp: , Rfl:    Allergies  Allergen Reactions  . Aricept [Donepezil Hcl] Rash  . Namenda [Memantine Hcl] Rash     Past Surgical History:  Procedure Laterality Date  . CHOLECYSTECTOMY  2007  . COLONOSCOPY W/ POLYPECTOMY  2019   cancerous polyp  . COLONOSCOPY WITH PROPOFOL N/A 09/08/2017   Procedure: COLONOSCOPY WITH PROPOFOL;  Surgeon: Lucilla Lame, MD;  Location: [facility];  Service: Gastroenterology;  Laterality: N/A;  . FOOT SURGERY Right   . [place] SURGERY  2007  . POLYPECTOMY N/A 09/08/2017   Procedure: POLYPECTOMY;  Surgeon: Lucilla Lame, MD;  Location: [facility];  Service: Gastroenterology;  Laterality: N/A;  . ROTATOR CUFF REPAIR Left   . VASECTOMY       Family History  Problem Relation Age of Onset  . Alcohol abuse Paternal Grandfather      Social History   Tobacco Use  . Smoking status: Light Tobacco Smoker    Packs/day: 0.25    Years: 35.00    Pack years: 8.75  . Smokeless tobacco: Never Used  . Tobacco comment: 1 PPWeek, Smoked 10 yrs, quit for 17, restarted 1.5 yrs ago. 1-2 cigarettes per day as of 03/11/20  Substance Use Topics  . Alcohol use: Yes    Alcohol/week: 3.0 standard drinks    Types: 3 Shots of liquor per week    Comment: martine 3x week    With staff assistance, above reviewed with the patient today.  ROS: As per HPI, otherwise no specific complaints on a limited and focused system review   No results found for this or any previous visit (from the past 72  hour(s)).   PHQ2/9: Depression screen [facility] 2/9 07/23/2020 03/11/2020 11/22/2019 07/03/2019 03/13/2019  Decreased Interest 0 0 0 0 0  Down, Depressed, Hopeless 0 0 0 0 0  PHQ - 2 Score 0 0 0 0 0  Altered sleeping 0 - 0 0 0  Tired, decreased energy 0 - 0 0 0  Change in appetite 0 - 0 0 0  Feeling bad or failure about yourself  0 - 0 0 0  Trouble concentrating 0 - 0 0 0  Moving slowly or fidgety/restless 0 - 0 0 0  Suicidal thoughts 0 - 0 0 0  PHQ-9 Score 0 - 0 0 0  Difficult doing work/chores Not difficult at all - Not difficult at all Not difficult at all Not difficult at all   PHQ-2/9 Result is neg  Fall Risk: Fall  Risk  07/23/2020 03/11/2020 11/22/2019 07/03/2019 03/13/2019  Falls in the past year? 0 0 0 0 0  Number falls in past yr: 0 0 0 0 -  Injury with Fall? 0 0 0 0 -  Comment - - - - -  Risk for fall due to : - No Fall Risks - - -  Risk for fall due to: Comment - - - - -  Follow up - Falls prevention discussed Falls evaluation completed - -      Objective:   Vitals:   07/23/20 0933  BP: 124/76  Pulse: 82  Resp: 16  Temp: 98.2 F (36.8 C)  TempSrc: Temporal  SpO2: 99%  Weight: 178 lb 11.2 oz (81.1 kg)  Height: 5\' 11"  (1.803 m)    Body mass index is 24.92 kg/m.  Physical Exam   NAD, masked, pleasant HEENT - [place]/AT, sclera anicteric, positive glasses,  PERRL, EOMI, conj - non-inj'ed, pharynx clear Neck - supple, no adenopathy, no TM, carotids 2+ and = without bruits bilat Car - RRR without m/g/r Pulm- RR and effort normal at rest, CTA without wheeze or rales Abd - soft, NT diffusely, ND,  Back - no CVA tenderness Skin-scattered erythematous scaly plaques were present on the lower extremities, many circular, also some on the elbow bilateral, more on the extensor surface, and many scattered on the buttock cheeks bilateral extending to the posterior thigh area. Ext - no LE edema, no active joints Neuro/psychiatric - affect was not flat, appropriate with  conversation  Alert and oriented  Grossly non-focal - good strength on testing extremities, sensation intact to LT in distal extremities  Speech normal   Results for orders placed or performed in visit on 11/22/19  COMPLETE METABOLIC PANEL WITH GFR  Result Value Ref Range   Glucose, Bld 106 (H) 65 - 99 mg/dL   BUN 22 7 - 25 mg/dL   Creat 0.81 0.70 - 1.25 mg/dL   GFR, Est Non African American 91 > OR = 60 mL/min/1.37m2   GFR, Est African American 106 > OR = 60 mL/min/1.52m2   BUN/Creatinine Ratio NOT APPLICABLE 6 - 22 (calc)   Sodium 143 135 - 146 mmol/L   Potassium 4.4 3.5 - 5.3 mmol/L   Chloride 107 98 - 110 mmol/L   CO2 21 20 - 32 mmol/L   Calcium 9.2 8.6 - 10.3 mg/dL   Total Protein 6.3 6.1 - 8.1 g/dL   Albumin 4.1 3.6 - 5.1 g/dL   Globulin 2.2 1.9 - 3.7 g/dL (calc)   AG Ratio 1.9 1.0 - 2.5 (calc)   Total Bilirubin 0.7 0.2 - 1.2 mg/dL   Alkaline phosphatase (APISO) 68 35 - 144 U/L   AST 44 (H) 10 - 35 U/L   ALT 45 9 - 46 U/L  Lipid panel  Result Value Ref Range   Cholesterol 137 <200 mg/dL   HDL 69 > OR = 40 mg/dL   Triglycerides 49 <150 mg/dL   LDL Cholesterol (Calc) 54 mg/dL (calc)   Total CHOL/HDL Ratio 2.0 <5.0 (calc)   Non-HDL Cholesterol (Calc) 68 <130 mg/dL (calc)  PSA  Result Value Ref Range   PSA 0.4 < OR = 4.0 ng/mL   Last labs from December reviewed     Assessment & Plan:   1. Essential hypertension Blood pressure remains well controlled on the Toprol-XL Continue current medication regimen.  2. Mixed hyperlipidemia Last lipid panel was good, although unclear what medications he is taking for this presently. Seems  to be a fish oil product presently, unclear if he is taking the atorvastatin presently. Notes he has really increased his diet modifications in the past to help in this realm.  Recommended continuing. Also is active regularly with walking, and also encouraged that to continue.  3. Benign non-nodular prostatic hyperplasia without lower  urinary tract symptoms Takes a Flomax product, no longer taking an Avodart product. Denied any concerning increased symptoms in the recent past. Last PSA check was good.  4. Psoriasis Has been managing with a Goldbond cream product, and feels like that manages things fairly well. Discussed having a steroid type cream to apply, especially if more problematic or more flares arising, and he states he would like to do so. We will provide that when I get the list of the medications he is taking presently.  5. Dupuytren contracture He decided to delay any surgical intervention after was seen previously as noted above.  6. Medication monitoring encounter Not entirely clear of the medications he is presently taking, and asked that when he gets home, he called the office and provide Melissa with the current medications he is taking. We can then update that with accuracy, and also he will let us know what medications he needs to have refilled if that is the case. I also asked that he bring his medications with him on future visits.  7.  Tubular adenoma history He will contact the GI office and inquire as to the recommended follow-up, often a 3 to 5-year follow-up recommended.  If it is 3 years, that would be due next month, and can provide a referral to have this done.  We will schedule a follow-up again in about 6 months time, with repeating labs at that follow-up visit.   Addendum Melissa spoke with the patient's wife who manages his medications, Medications are as follows:  Dutasteride 0.5mg  once daily (Dr. Rogers Blocker) Tamsulosin 0.4mg  once daily (Dr. Rogers Blocker) Metoprolol 100mg  once daily Trazodone 50mg  ([name]) as needed only  Vitamin D, C and B12 daily  She noted the urologist refills his dutasteride and tamsulosin medications, and that he needed a refill of his metoprolol product. That medicine was refilled. Also, I prescribed a medication for his psoriasis, a steroid cream to use  topically when he has flares of his psoriasis, and assess his response.  Towanda Malkin, MD 07/23/20 10:24 AM

## 2020-07-23 ENCOUNTER — Other Ambulatory Visit: Payer: Self-pay

## 2020-07-23 ENCOUNTER — Ambulatory Visit (INDEPENDENT_AMBULATORY_CARE_PROVIDER_SITE_OTHER): Payer: Medicare HMO | Admitting: Internal Medicine

## 2020-07-23 ENCOUNTER — Encounter: Payer: Self-pay | Admitting: Internal Medicine

## 2020-07-23 VITALS — BP 124/76 | HR 82 | Temp 98.2°F | Resp 16 | Ht 71.0 in | Wt 178.7 lb

## 2020-07-23 DIAGNOSIS — Z5181 Encounter for therapeutic drug level monitoring: Secondary | ICD-10-CM | POA: Diagnosis not present

## 2020-07-23 DIAGNOSIS — M720 Palmar fascial fibromatosis [Dupuytren]: Secondary | ICD-10-CM | POA: Diagnosis not present

## 2020-07-23 DIAGNOSIS — I10 Essential (primary) hypertension: Secondary | ICD-10-CM | POA: Diagnosis not present

## 2020-07-23 DIAGNOSIS — N400 Enlarged prostate without lower urinary tract symptoms: Secondary | ICD-10-CM | POA: Diagnosis not present

## 2020-07-23 DIAGNOSIS — E782 Mixed hyperlipidemia: Secondary | ICD-10-CM | POA: Diagnosis not present

## 2020-07-23 DIAGNOSIS — D126 Benign neoplasm of colon, unspecified: Secondary | ICD-10-CM

## 2020-07-23 DIAGNOSIS — L409 Psoriasis, unspecified: Secondary | ICD-10-CM | POA: Diagnosis not present

## 2020-07-23 NOTE — Patient Instructions (Addendum)
Please call GI who did the colonoscopy and ask for f/u recommendations after one done in 2018.  Please contact the office when you get home and have the list of the medications you are taking and share the medications you are taking with Melissa.  Please note the ones that you need refilled.  Also, please bring the medications that you are taking to all future visits

## 2020-07-24 ENCOUNTER — Other Ambulatory Visit: Payer: Self-pay

## 2020-07-24 MED ORDER — MOMETASONE FUROATE 0.1 % EX CREA: 1.0000 "application " | TOPICAL_CREAM | Freq: Every day | CUTANEOUS | 1 refills | Status: DC

## 2020-07-24 MED ORDER — METOPROLOL SUCCINATE ER 100 MG PO TB24: 100.0000 mg | ORAL_TABLET | Freq: Every day | ORAL | 3 refills | Status: DC

## 2020-07-24 NOTE — Telephone Encounter (Signed)
I spoke with the patient's wifewhoe manages his medications, Medications are as follows:  Dutasteride 0.5mg  once daily (Dr. Rogers Blocker) Tamsulosin 0.4mg  once daily (Dr. Rogers Blocker) Metoprolol 100mg  once daily Trazodone 50mg  ([name]) as needed only  Vitamin D, C and B12 daily

## 2020-07-24 NOTE — Addendum Note (Signed)
Addended by: Lebron Conners D on: 07/24/2020 11:47 AM   Modules accepted: Orders

## 2020-11-04 DIAGNOSIS — H43812 Vitreous degeneration, left eye: Secondary | ICD-10-CM | POA: Diagnosis not present

## 2020-11-04 DIAGNOSIS — H02889 Meibomian gland dysfunction of unspecified eye, unspecified eyelid: Secondary | ICD-10-CM | POA: Diagnosis not present

## 2020-11-04 DIAGNOSIS — H5203 Hypermetropia, bilateral: Secondary | ICD-10-CM | POA: Diagnosis not present

## 2021-01-15 DIAGNOSIS — M5451 Vertebrogenic low back pain: Secondary | ICD-10-CM | POA: Diagnosis not present

## 2021-01-15 DIAGNOSIS — M461 Sacroiliitis, not elsewhere classified: Secondary | ICD-10-CM | POA: Diagnosis not present

## 2021-01-15 DIAGNOSIS — M9903 Segmental and somatic dysfunction of lumbar region: Secondary | ICD-10-CM | POA: Diagnosis not present

## 2021-01-15 DIAGNOSIS — M9904 Segmental and somatic dysfunction of sacral region: Secondary | ICD-10-CM | POA: Diagnosis not present

## 2021-01-16 DIAGNOSIS — M5451 Vertebrogenic low back pain: Secondary | ICD-10-CM | POA: Diagnosis not present

## 2021-01-16 DIAGNOSIS — M461 Sacroiliitis, not elsewhere classified: Secondary | ICD-10-CM | POA: Diagnosis not present

## 2021-01-16 DIAGNOSIS — M9903 Segmental and somatic dysfunction of lumbar region: Secondary | ICD-10-CM | POA: Diagnosis not present

## 2021-01-16 DIAGNOSIS — M9904 Segmental and somatic dysfunction of sacral region: Secondary | ICD-10-CM | POA: Diagnosis not present

## 2021-01-28 ENCOUNTER — Emergency Department: Payer: Medicare HMO

## 2021-01-28 ENCOUNTER — Emergency Department
Admission: EM | Admit: 2021-01-28 | Discharge: 2021-01-28 | Disposition: A | Discharge status: Home / Self Care | Payer: Medicare HMO | Attending: Emergency Medicine | Admitting: Emergency Medicine

## 2021-01-28 ENCOUNTER — Other Ambulatory Visit: Payer: Self-pay

## 2021-01-28 DIAGNOSIS — W1839XA Other fall on same level, initial encounter: Secondary | ICD-10-CM | POA: Diagnosis not present

## 2021-01-28 DIAGNOSIS — R69 Illness, unspecified: Secondary | ICD-10-CM | POA: Diagnosis not present

## 2021-01-28 DIAGNOSIS — Z79899 Other long term (current) drug therapy: Secondary | ICD-10-CM | POA: Diagnosis not present

## 2021-01-28 DIAGNOSIS — R102 Pelvic and perineal pain: Secondary | ICD-10-CM | POA: Diagnosis not present

## 2021-01-28 DIAGNOSIS — W19XXXA Unspecified fall, initial encounter: Secondary | ICD-10-CM

## 2021-01-28 DIAGNOSIS — Y92009 Unspecified place in unspecified non-institutional (private) residence as the place of occurrence of the external cause: Secondary | ICD-10-CM

## 2021-01-28 DIAGNOSIS — S00219A Abrasion of unspecified eyelid and periocular area, initial encounter: Secondary | ICD-10-CM

## 2021-01-28 DIAGNOSIS — S0083XA Contusion of other part of head, initial encounter: Secondary | ICD-10-CM | POA: Diagnosis not present

## 2021-01-28 DIAGNOSIS — F0390 Unspecified dementia without behavioral disturbance: Secondary | ICD-10-CM | POA: Insufficient documentation

## 2021-01-28 DIAGNOSIS — S00211A Abrasion of right eyelid and periocular area, initial encounter: Secondary | ICD-10-CM | POA: Diagnosis not present

## 2021-01-28 DIAGNOSIS — F17210 Nicotine dependence, cigarettes, uncomplicated: Secondary | ICD-10-CM | POA: Insufficient documentation

## 2021-01-28 DIAGNOSIS — S0993XA Unspecified injury of face, initial encounter: Secondary | ICD-10-CM | POA: Diagnosis not present

## 2021-01-28 DIAGNOSIS — Y92002 Bathroom of unspecified non-institutional (private) residence single-family (private) house as the place of occurrence of the external cause: Secondary | ICD-10-CM | POA: Insufficient documentation

## 2021-01-28 DIAGNOSIS — I10 Essential (primary) hypertension: Secondary | ICD-10-CM | POA: Diagnosis not present

## 2021-01-28 DIAGNOSIS — S0990XA Unspecified injury of head, initial encounter: Secondary | ICD-10-CM | POA: Diagnosis not present

## 2021-01-28 DIAGNOSIS — R6889 Other general symptoms and signs: Secondary | ICD-10-CM | POA: Diagnosis not present

## 2021-01-28 DIAGNOSIS — Z743 Need for continuous supervision: Secondary | ICD-10-CM | POA: Diagnosis not present

## 2021-01-28 DIAGNOSIS — S00212A Abrasion of left eyelid and periocular area, initial encounter: Secondary | ICD-10-CM | POA: Insufficient documentation

## 2021-01-28 DIAGNOSIS — M545 Low back pain, unspecified: Secondary | ICD-10-CM | POA: Diagnosis not present

## 2021-01-28 LAB — CBC WITH DIFFERENTIAL/PLATELET
Abs Immature Granulocytes: 0.04 10*3/uL (ref 0.00–0.07)
Basophils Absolute: 0 10*3/uL (ref 0.0–0.1)
Basophils Relative: 0 %
Eosinophils Absolute: 0 10*3/uL (ref 0.0–0.5)
Eosinophils Relative: 0 %
HCT: 37.7 % — ABNORMAL LOW (ref 39.0–52.0)
Hemoglobin: 12.9 g/dL — ABNORMAL LOW (ref 13.0–17.0)
Immature Granulocytes: 0 %
Lymphocytes Relative: 3 %
Lymphs Abs: 0.3 10*3/uL — ABNORMAL LOW (ref 0.7–4.0)
MCH: 33.6 pg (ref 26.0–34.0)
MCHC: 34.2 g/dL (ref 30.0–36.0)
MCV: 98.2 fL (ref 80.0–100.0)
Monocytes Absolute: 0.7 10*3/uL (ref 0.1–1.0)
Monocytes Relative: 8 %
Neutro Abs: 8.2 10*3/uL — ABNORMAL HIGH (ref 1.7–7.7)
Neutrophils Relative %: 89 %
Platelets: 112 10*3/uL — ABNORMAL LOW (ref 150–400)
RBC: 3.84 MIL/uL — ABNORMAL LOW (ref 4.22–5.81)
RDW: 15 % (ref 11.5–15.5)
WBC: 9.2 10*3/uL (ref 4.0–10.5)
nRBC: 0 % (ref 0.0–0.2)

## 2021-01-28 LAB — COMPREHENSIVE METABOLIC PANEL
ALT: 30 U/L (ref 0–44)
AST: 60 U/L — ABNORMAL HIGH (ref 15–41)
Albumin: 4.4 g/dL (ref 3.5–5.0)
Alkaline Phosphatase: 91 U/L (ref 38–126)
Anion gap: 14 (ref 5–15)
BUN: 29 mg/dL — ABNORMAL HIGH (ref 8–23)
CO2: 28 mmol/L (ref 22–32)
Calcium: 8.9 mg/dL (ref 8.9–10.3)
Chloride: 93 mmol/L — ABNORMAL LOW (ref 98–111)
Creatinine, Ser: 0.82 mg/dL (ref 0.61–1.24)
GFR, Estimated: >60 mL/min (ref >60)
Glucose, Bld: 126 mg/dL — ABNORMAL HIGH (ref 70–99)
Potassium: 3.7 mmol/L (ref 3.5–5.1)
Sodium: 135 mmol/L (ref 135–145)
Total Bilirubin: 4.5 mg/dL — ABNORMAL HIGH (ref 0.3–1.2)
Total Protein: 7.2 g/dL (ref 6.5–8.1)

## 2021-01-28 MED ORDER — SODIUM CHLORIDE 0.9 % IV BOLUS
500.0000 mL | Freq: Once | INTRAVENOUS | Status: AC
  Administered 2021-01-28: 500 mL via INTRAVENOUS

## 2021-01-28 NOTE — ED Triage Notes (Addendum)
Pt comes via EMs from home with c/o fall. Pt states he fell last night. Pt was found by wife this am. Wife states he was so weak she couldn't get him up. EMS reports pt walked to stretcher with no difficulty.  Pt has hx of alcohol abuse.  Pt has bruising noted to left eye with bandage in place. Pt denies any blood thinners.  Pt states he was making bed and tripped and fell.

## 2021-01-28 NOTE — ED Notes (Signed)
See triage note   States he tripped up while making the bed   Hitting the left side of face  Small laceration noted over left eye    Was found by wife this am

## 2021-01-28 NOTE — ED Notes (Signed)
Pt has been up in room   Washing hands   Easily encouraged to get back on the stretcher

## 2021-01-28 NOTE — Discharge Instructions (Addendum)
Follow-up with your primary care provider if any continued problems or concerns.  The abrasion to the eyebrow did not require sutures but still needs to be clean daily with mild soap and water and watch for any signs of infection.  You may also apply ice packs to help reduce the swelling.  Continue with his regular medication.  The bruising to his face will look worse the next 2 to 3 days than it does currently.  Return to the emergency department if any severe worsening of his symptoms or urgent concerns.  CT of his head, and face were negative for any acute fractures.  Also his pelvis and lower back were x-rayed as he had some mild complaint and this also only showed degenerative changes but no new injury or fracture.

## 2021-01-28 NOTE — ED Notes (Signed)
Spoke with wife  States he fell in the bathroom and hit his head during the night

## 2021-01-28 NOTE — ED Notes (Signed)
cont's to be slightly agitated   Pulled out Saline Lock   Redirected back to room

## 2021-01-28 NOTE — ED Notes (Signed)
Pt alert and stable for discharge. RR even and unlabored, color WNL. Discussed discharge instructions and follow-up as directed. Discharge medications discussed if prescribed. Pt had opportunity to ask questions, and RN to provide patient/family eduction. Left with wife, Irving Copas.

## 2021-01-28 NOTE — ED Provider Notes (Signed)
[facility] Emergency Department Provider Note  ____________________________________________   Event Date/Time   First MD Initiated Contact with Patient 01/28/21 [id number]     (approximate)  I have reviewed the triage vital signs and the nursing notes.   HISTORY  Chief Complaint Fall   HPI Jeffrey Yates is a 70 y.o. male brought to the ED via EMS.  Patient has a history of dementia and tells ED staff that while making up his bed a spraying hit him in his face causing laceration over his left eye.  Wife was called and patient was noted to have fallen in the bathroom sometime during the night and got up and went back to bed.  Wife states that he is in his normal state of confusion.  She also denies that he was on the floor and was unable to get up as previously documented by triage.       Past Medical History:  Diagnosis Date  . Abnormal liver enzymes 06/29/2016  . Alcohol abuse 01/06/15   Siezure from Withdrawl - 11/26/14  . Allergy   . Gilbert's disease 05/17/2018   Seen by GI 2017  . Gout 09/22/2015  . Hyperlipidemia   . Hypertension   . Neuralgia neuritis, sciatic nerve 06/29/2016  . Psoriasis 09/22/2015  . Seizure ([place]) 07/14/2015    Patient Active Problem List   Diagnosis Date Noted  . Tubular adenoma of colon 07/23/2020  . Memory difficulties 01/10/2019  . Gilbert's disease 05/17/2018  . Insomnia 05/15/2018  . Neuralgia neuritis, sciatic nerve 06/29/2016  . Dupuytren's contracture 05/31/2016  . Snoring 05/26/2016  . Gout 09/22/2015  . Psoriasis 09/22/2015  . Hypertension 07/14/2015  . Hyperlipidemia 07/14/2015  . AA (alcohol abuse) 07/14/2015  . Allergic rhinitis 07/14/2015  . Benign non-nodular prostatic hyperplasia without lower urinary tract symptoms 11/26/2014    Past Surgical History:  Procedure Laterality Date  . CHOLECYSTECTOMY  2007  . COLONOSCOPY W/ POLYPECTOMY  2019   cancerous polyp  . COLONOSCOPY WITH PROPOFOL N/A 09/08/2017    Procedure: COLONOSCOPY WITH PROPOFOL;  Surgeon: Lucilla Lame, MD;  Location: [place];  Service: Gastroenterology;  Laterality: N/A;  . FOOT SURGERY Right   . [place] SURGERY  2007  . POLYPECTOMY N/A 09/08/2017   Procedure: POLYPECTOMY;  Surgeon: Lucilla Lame, MD;  Location: [place];  Service: Gastroenterology;  Laterality: N/A;  . ROTATOR CUFF REPAIR Left   . VASECTOMY      Prior to Admission medications   Medication Sig Start Date End Date Taking? Authorizing Provider  ascorbic acid (VITAMIN C) 1000 MG tablet Take 1,000 mg by mouth daily.    [provider]  Cholecalciferol (VITAMIN D3) 1000 UNITS CAPS Take 1 capsule by mouth.    [provider]  dutasteride (AVODART) 0.5 MG capsule Take 0.5 mg by mouth daily.    [provider]  metoprolol succinate (TOPROL-XL) 100 MG 24 hr tablet Take 1 tablet (100 mg total) by mouth daily. 07/24/20   Towanda Malkin, MD  mometasone (ELOCON) 0.1 % cream Apply 1 application topically daily. As needed for flares, use sparingly 07/24/20   Towanda Malkin, MD  tamsulosin (FLOMAX) 0.4 MG CAPS capsule Take 0.4 mg by mouth daily. 11/20/19   [provider]  traZODone (DESYREL) 50 MG tablet TAKE 1 TABLET(50 MG) BY MOUTH AT BEDTIME AS NEEDED FOR SLEEP 04/23/20   Hubbard Hartshorn, FNP  vitamin B-12 (CYANOCOBALAMIN) 1000 MCG tablet Take 1,000 mcg by  mouth daily.    [provider]    Allergies Aricept Reather Littler hcl] and Namenda [memantine hcl]  Family History  Problem Relation Age of Onset  . Alcohol abuse Paternal Grandfather     Social History Social History   Tobacco Use  . Smoking status: Light Tobacco Smoker    Packs/day: 0.25    Years: 35.00    Pack years: 8.75  . Smokeless tobacco: Never Used  . Tobacco comment: 1 PPWeek, Smoked 10 yrs, quit for 17, restarted 1.5 yrs ago. 1-2 cigarettes per day as of 03/11/20  Vaping Use  . Vaping Use: Never used  Substance  Use Topics  . Alcohol use: Yes    Alcohol/week: 3.0 standard drinks    Types: 3 Shots of liquor per week    Comment: martine 3x week  . Drug use: No    Review of Systems Constitutional: No fever/chills Eyes: No visual changes.  Skin avulsion left eyebrow. ENT: No trauma. Cardiovascular: Denies chest pain. Respiratory: Denies shortness of breath. Gastrointestinal: No abdominal pain.  No nausea, no vomiting. Genitourinary: Negative for dysuria. Musculoskeletal: Complaint of cervical pain, pelvis and low back. Skin: Negative for rash. Neurological: Negative for headaches, focal weakness or numbness. Psychiatric:  History of dementia.    ____________________________________________   PHYSICAL EXAM:  VITAL SIGNS: ED Triage Vitals  Enc Vitals Group     BP 01/28/21 0919 (!) 166/93     Pulse Rate 01/28/21 0919 87     Resp 01/28/21 0919 18     Temp 01/28/21 0919 98.4 F (36.9 C)     Temp Source 01/28/21 0919 Oral     SpO2 01/28/21 0919 97 %     Weight 01/28/21 0917 180 lb (81.6 kg)     Height 01/28/21 0917 5\' 10"  (1.778 m)     Head Circumference --      Peak Flow --      Pain Score --      Pain Loc --      Pain Edu? --      Excl. in [place]? --     Constitutional: Alert and oriented. Well appearing and in no acute distress.  Patient is cooperative and talkative however he is confused with orientation of place and time. Eyes: Conjunctivae are normal. PERRL. EOMI. Head: Atraumatic. Nose: No congestion/rhinnorhea.  No trauma. Mouth/Throat: No dental injury noted. Neck: No stridor.  Nontender cervical spine to palpation posteriorly.  Range of motion without restriction. Cardiovascular: Normal rate, regular rhythm. Grossly normal heart sounds.  Good peripheral circulation. Respiratory: Normal respiratory effort.  No retractions. Lungs CTAB.  No tenderness on palpation of the ribs bilaterally and no evidence of abrasions or ecchymosis. Gastrointestinal: Soft and nontender. No  distention.  No skin discoloration, bowel sounds normoactive x4 quadrants. Musculoskeletal: No tenderness is noted on palpation of the thoracic spine however patient does have some minimal tenderness.  No deformity or skin discoloration is noted in this area.  On compression of the pelvis patient states that this is "sore" but no abrasions or discoloration is noted of the skin and see area.  Patient is able move upper and lower extremities they have difficulty.  He is ambulatory without any assistance and is walking around in the department frequently. Neurologic:  Normal speech and language. No gross focal neurologic deficits are appreciated. No gait instability. Skin:  Skin is warm, dry and intact.  To the right eyebrow there is a superficial skin avulsion without active bleeding.  This was  cleaned with normal saline and no foreign body is noted.  There is no gaping in this area.  Nontender to palpation.  Bleeding is controlled. Psychiatric: Mood and affect are normal. Speech and behavior are normal.  ____________________________________________   LABS (all labs ordered are listed, but only abnormal results are displayed)  Labs Reviewed  COMPREHENSIVE METABOLIC PANEL - Abnormal; Notable for the following components:      Result Value   Chloride 93 (*)    Glucose, Bld 126 (*)    BUN 29 (*)    AST 60 (*)    Total Bilirubin 4.5 (*)    All other components within normal limits  CBC WITH DIFFERENTIAL/PLATELET - Abnormal; Notable for the following components:   RBC 3.84 (*)    Hemoglobin 12.9 (*)    HCT 37.7 (*)    Platelets 112 (*)    Neutro Abs 8.2 (*)    Lymphs Abs 0.3 (*)    All other components within normal limits  URINALYSIS, COMPLETE (UACMP) WITH MICROSCOPIC    RADIOLOGY I, Johnn Hai, personally viewed and evaluated these images (plain radiographs) as part of my medical decision making, as well as reviewing the written report by the radiologist.   Official radiology  report(s): DG Lumbar Spine 2-3 Views  Result Date: 01/28/2021 CLINICAL DATA:  Low back pain after a fall last night. Initial encounter. EXAM: LUMBAR SPINE - 2-3 VIEW COMPARISON:  Plain films lumbar spine 11/13/2012. FINDINGS: No fracture or malalignment. Multilevel degenerative disc disease has markedly worsened since the prior examination. Facet arthropathy has also progressed. Paraspinous structures demonstrate aortic atherosclerosis. IMPRESSION: No acute finding. Multilevel degenerative disease. Aortic Atherosclerosis (ICD10-I70.0). Electronically Signed   By: Inge Rise M.D.   On: 01/28/2021 14:36   DG Pelvis 1-2 Views  Result Date: 01/28/2021 CLINICAL DATA:  Pelvic pain after a fall last night. Initial encounter. EXAM: PELVIS - 1-2 VIEW COMPARISON:  None. FINDINGS: There is no evidence of pelvic fracture or diastasis. No pelvic bone lesions are seen. IMPRESSION: Negative exam. Electronically Signed   By: Inge Rise M.D.   On: 01/28/2021 14:36   CT Head Wo Contrast  Result Date: 01/28/2021 CLINICAL DATA:  Fall, facial trauma EXAM: CT HEAD WITHOUT CONTRAST TECHNIQUE: Contiguous axial images were obtained from the base of the skull through the vertex without intravenous contrast. COMPARISON:  08/10/2010 FINDINGS: Brain: No acute intracranial abnormality. Specifically, no hemorrhage, hydrocephalus, mass lesion, acute infarction, or significant intracranial injury. Vascular: No hyperdense vessel or unexpected calcification. Skull: No acute calvarial abnormality. Sinuses/Orbits: Visualized paranasal sinuses and mastoids clear. Orbital soft tissues unremarkable. Other: None IMPRESSION: No acute intracranial abnormality. Electronically Signed   By: Rolm Baptise M.D.   On: 01/28/2021 09:53   CT Maxillofacial Wo Contrast  Result Date: 01/28/2021 CLINICAL DATA:  Fall, facial trauma EXAM: CT MAXILLOFACIAL WITHOUT CONTRAST TECHNIQUE: Multidetector CT imaging of the maxillofacial structures was  performed. Multiplanar CT image reconstructions were also generated. COMPARISON:  None. FINDINGS: Osseous: No fracture or mandibular dislocation. No destructive process. Orbits: Negative. No traumatic or inflammatory finding. Sinuses: Mucosal thickening in the maxillary sinuses and left sphenoid sinus. Soft tissues: Soft tissue swelling over the left orbit. Limited intracranial: None IMPRESSION: No facial or orbital fracture. Electronically Signed   By: Rolm Baptise M.D.   On: 01/28/2021 09:54    ____________________________________________   PROCEDURES  Procedure(s) performed (including Critical Care):  Procedures   ____________________________________________   INITIAL IMPRESSION / ASSESSMENT AND PLAN / ED COURSE  As part of my medical decision making, I reviewed the following data within the electronic MEDICAL RECORD NUMBER Notes from prior ED visits and [place] Controlled Substance Database  70 year old male was brought to the ED via EMS after his wife noted some blood in the bathroom and a questionable laceration to his left eyebrow.  Wife states that he apparently fell in the bathroom but then got up on his own and went back to bed.  She states that his normal baseline is that of confusion and he is not acting any differently than he does at home.  The left eyebrow area was cleaned and no actual laceration was noted however there was a skin avulsion and bleeding was controlled.  CT scan of the maxillofacial and head were negative for any acute changes.  Also lumbar spine and pelvis did not show any acute fracture.  Patient was ambulatory frequently in the ED.  Wife was called and given an update 3 times while he was in the ED with the third time being information about picking him up.  She is to follow-up with his PCP if any continued problems or return to the emergency department if any urgent concerns.  Patient was pleasant, nonviolent and was able to eat a snack while  waiting.  ____________________________________________   FINAL CLINICAL IMPRESSION(S) / ED DIAGNOSES  Final diagnoses:  Abrasion of eyebrow, initial encounter  Contusion of face, initial encounter  Fall at home, initial encounter     ED Discharge Orders    None      *Please note:  Jeffrey Yates was evaluated in Emergency Department on 01/28/2021 for the symptoms described in the history of present illness. He was evaluated in the context of the global COVID-19 pandemic, which necessitated consideration that the patient might be at risk for infection with the SARS-CoV-2 virus that causes COVID-19. Institutional protocols and algorithms that pertain to the evaluation of patients at risk for COVID-19 are in a state of rapid change based on information released by regulatory bodies including the CDC and federal and state organizations. These policies and algorithms were followed during the patient's care in the ED.  Some ED evaluations and interventions may be delayed as a result of limited staffing during and the pandemic.*   Note:  This document was prepared using Dragon voice recognition software and may include unintentional dictation errors.    Johnn Hai, PA-C 01/28/21 1526    Arta Silence, MD 01/28/21 1544

## 2021-01-28 NOTE — ED Notes (Signed)
Pt taken to xray via stretcher  

## 2021-02-18 DIAGNOSIS — Z125 Encounter for screening for malignant neoplasm of prostate: Secondary | ICD-10-CM | POA: Diagnosis not present

## 2021-02-18 DIAGNOSIS — Z79899 Other long term (current) drug therapy: Secondary | ICD-10-CM | POA: Diagnosis not present

## 2021-02-18 DIAGNOSIS — N401 Enlarged prostate with lower urinary tract symptoms: Secondary | ICD-10-CM | POA: Diagnosis not present

## 2021-02-18 DIAGNOSIS — D400 Neoplasm of uncertain behavior of prostate: Secondary | ICD-10-CM | POA: Diagnosis not present

## 2021-02-18 DIAGNOSIS — R972 Elevated prostate specific antigen [PSA]: Secondary | ICD-10-CM | POA: Diagnosis not present

## 2021-03-12 ENCOUNTER — Ambulatory Visit: Payer: Medicare HMO

## 2021-03-17 ENCOUNTER — Telehealth: Payer: Self-pay | Admitting: Family Medicine

## 2021-03-17 NOTE — Telephone Encounter (Signed)
Copied from [place] [id number]. Topic: Medicare AWV >> Mar 17, 2021 10:18 AM Cher Nakai R wrote: Reason for CRM:   Left message for patient to call back and schedule Medicare Annual Wellness Visit (AWV) in office.   If unable to come into the office for AWV,  please offer to do virtually or by telephone.  Last AWV: 03/11/2020  Please schedule at anytime with [place].  40 minute appointment  Any questions, please contact me at [phone]

## 2021-05-12 ENCOUNTER — Telehealth: Payer: Self-pay | Admitting: Family Medicine

## 2021-05-12 NOTE — Telephone Encounter (Signed)
Copied from [place] [id number]. Topic: Medicare AWV >> May 12, 2021  1:10 PM Cher Nakai R wrote: Reason for CRM:   Left message for patient to call back and schedule Medicare Annual Wellness Visit (AWV) in office.   If unable to come into the office for AWV,  please offer to do virtually or by telephone.  Last AWV:   03/11/2020  Please schedule at anytime with [place].  40 minute appointment  Any questions, please contact me at [phone]

## 2021-05-14 DIAGNOSIS — M461 Sacroiliitis, not elsewhere classified: Secondary | ICD-10-CM | POA: Diagnosis not present

## 2021-05-14 DIAGNOSIS — M9903 Segmental and somatic dysfunction of lumbar region: Secondary | ICD-10-CM | POA: Diagnosis not present

## 2021-05-14 DIAGNOSIS — M5442 Lumbago with sciatica, left side: Secondary | ICD-10-CM | POA: Diagnosis not present

## 2021-05-14 DIAGNOSIS — M9904 Segmental and somatic dysfunction of sacral region: Secondary | ICD-10-CM | POA: Diagnosis not present

## 2021-05-18 DIAGNOSIS — M9903 Segmental and somatic dysfunction of lumbar region: Secondary | ICD-10-CM | POA: Diagnosis not present

## 2021-05-18 DIAGNOSIS — M9904 Segmental and somatic dysfunction of sacral region: Secondary | ICD-10-CM | POA: Diagnosis not present

## 2021-05-18 DIAGNOSIS — M5442 Lumbago with sciatica, left side: Secondary | ICD-10-CM | POA: Diagnosis not present

## 2021-05-18 DIAGNOSIS — M461 Sacroiliitis, not elsewhere classified: Secondary | ICD-10-CM | POA: Diagnosis not present

## 2021-09-14 ENCOUNTER — Emergency Department (HOSPITAL_COMMUNITY)
Admission: EM | Admit: 2021-09-14 | Discharge: 2021-09-14 | Disposition: A | Discharge status: Home / Self Care | Payer: Medicare HMO | Attending: Student | Admitting: Student

## 2021-09-14 ENCOUNTER — Ambulatory Visit: Payer: Self-pay

## 2021-09-14 ENCOUNTER — Other Ambulatory Visit: Payer: Self-pay

## 2021-09-14 ENCOUNTER — Encounter (HOSPITAL_COMMUNITY): Payer: Self-pay

## 2021-09-14 DIAGNOSIS — R569 Unspecified convulsions: Secondary | ICD-10-CM | POA: Diagnosis not present

## 2021-09-14 DIAGNOSIS — F1020 Alcohol dependence, uncomplicated: Secondary | ICD-10-CM | POA: Insufficient documentation

## 2021-09-14 DIAGNOSIS — Z5321 Procedure and treatment not carried out due to patient leaving prior to being seen by health care provider: Secondary | ICD-10-CM | POA: Insufficient documentation

## 2021-09-14 DIAGNOSIS — F10239 Alcohol dependence with withdrawal, unspecified: Secondary | ICD-10-CM | POA: Insufficient documentation

## 2021-09-14 DIAGNOSIS — R69 Illness, unspecified: Secondary | ICD-10-CM | POA: Diagnosis not present

## 2021-09-14 DIAGNOSIS — Y900 Blood alcohol level of less than 20 mg/100 ml: Secondary | ICD-10-CM | POA: Diagnosis not present

## 2021-09-14 LAB — CBC WITH DIFFERENTIAL/PLATELET
Abs Immature Granulocytes: 0.02 10*3/uL (ref 0.00–0.07)
Basophils Absolute: 0 10*3/uL (ref 0.0–0.1)
Basophils Relative: 1 %
Eosinophils Absolute: 0.1 10*3/uL (ref 0.0–0.5)
Eosinophils Relative: 2 %
HCT: 40.3 % (ref 39.0–52.0)
Hemoglobin: 13.2 g/dL (ref 13.0–17.0)
Immature Granulocytes: 0 %
Lymphocytes Relative: 13 %
Lymphs Abs: 0.7 10*3/uL (ref 0.7–4.0)
MCH: 32.7 pg (ref 26.0–34.0)
MCHC: 32.8 g/dL (ref 30.0–36.0)
MCV: 99.8 fL (ref 80.0–100.0)
Monocytes Absolute: 0.8 10*3/uL (ref 0.1–1.0)
Monocytes Relative: 15 %
Neutro Abs: 3.8 10*3/uL (ref 1.7–7.7)
Neutrophils Relative %: 69 %
Platelets: 191 10*3/uL (ref 150–400)
RBC: 4.04 MIL/uL — ABNORMAL LOW (ref 4.22–5.81)
RDW: 13.8 % (ref 11.5–15.5)
WBC: 5.5 10*3/uL (ref 4.0–10.5)
nRBC: 0 % (ref 0.0–0.2)

## 2021-09-14 LAB — RAPID URINE DRUG SCREEN, HOSP PERFORMED
Amphetamines: NOT DETECTED
Barbiturates: NOT DETECTED
Benzodiazepines: NOT DETECTED
Cocaine: NOT DETECTED
Opiates: NOT DETECTED
Tetrahydrocannabinol: NOT DETECTED

## 2021-09-14 LAB — URINALYSIS, ROUTINE W REFLEX MICROSCOPIC
Bacteria, UA: NONE SEEN
Bilirubin Urine: NEGATIVE
Glucose, UA: NEGATIVE mg/dL
Ketones, ur: NEGATIVE mg/dL
Leukocytes,Ua: NEGATIVE
Nitrite: NEGATIVE
Protein, ur: NEGATIVE mg/dL
Specific Gravity, Urine: 1.006 (ref 1.005–1.030)
pH: 8 (ref 5.0–8.0)

## 2021-09-14 LAB — COMPREHENSIVE METABOLIC PANEL
ALT: 14 U/L (ref 0–44)
AST: 26 U/L (ref 15–41)
Albumin: 4.2 g/dL (ref 3.5–5.0)
Alkaline Phosphatase: 110 U/L (ref 38–126)
Anion gap: 9 (ref 5–15)
BUN: 15 mg/dL (ref 8–23)
CO2: 26 mmol/L (ref 22–32)
Calcium: 9.2 mg/dL (ref 8.9–10.3)
Chloride: 102 mmol/L (ref 98–111)
Creatinine, Ser: 0.77 mg/dL (ref 0.61–1.24)
GFR, Estimated: >60 mL/min (ref >60)
Glucose, Bld: 104 mg/dL — ABNORMAL HIGH (ref 70–99)
Potassium: 3.7 mmol/L (ref 3.5–5.1)
Sodium: 137 mmol/L (ref 135–145)
Total Bilirubin: 1.6 mg/dL — ABNORMAL HIGH (ref 0.3–1.2)
Total Protein: 6.7 g/dL (ref 6.5–8.1)

## 2021-09-14 LAB — ETHANOL: Alcohol, Ethyl (B): <10 mg/dL (ref <10)

## 2021-09-14 LAB — ACETAMINOPHEN LEVEL: Acetaminophen (Tylenol), Serum: <10 ug/mL — ABNORMAL LOW (ref 10–30)

## 2021-09-14 LAB — SALICYLATE LEVEL: Salicylate Lvl: <7 mg/dL — ABNORMAL LOW (ref 7.0–30.0)

## 2021-09-14 NOTE — Telephone Encounter (Signed)
Reason for Disposition  [1] Drinking alcohol daily AND [2] prior alcohol withdrawal seizures  Answer Assessment - Initial Assessment Questions 1. DO YOU DRINK: "Do you drink alcohol, including beer, wine or hard liquor?"     Vodka 2. HOW OFTEN: "How many days per week do you typically drink alcohol?"     Every day 3. HOW MUCH: "How many drinks do you typically have on days when you drink?" (1.5 oz hard liquor [one shot or jigger; 45 ml], 5 oz wine [small glass; 150 ml], 12 oz beer [one can; 360 ml])     8-10 shots 4. MOST: "What is the most that you have had to drink on any one occasion in the last month?"     Unsure 5. LAST 24 HOURS: "Have you had a drink within the last 24 hours?"     Last drink 3 days ago 6. DRINKING PROBLEM: "Do you have or have you ever had a drinking problem?"     Yes 7. DRUG PROBLEM: "Are you using any other drugs?" (e.g., yes/no; cocaine, prescription medications, etc.)     No 8. SYMPTOMS: "What symptoms are you currently experiencing?" (e.g., none, tremors, or shakiness, abdominal pain, vomiting, blackout spells)     Feels good 9. DETOX PROGRAM: "Have you ever gone through a detox program?"     Yes 10. THERAPIST: "Do you have a counselor or therapist? Name?"       No 11. SUPPORT: "Who is with you now?" "Who do you live with?" "Do you have family or friends who you can talk to?" "Are you a member of Alcoholics Anonymous?"       Sister 73. PREGNANCY: "Is there any chance you are pregnant?" "When was your last menstrual period?"       N/a  Protocols used: Alcohol Use and Problems-A-AH

## 2021-09-14 NOTE — ED Triage Notes (Addendum)
Pt states he is here to help find resources for a detox facility for alcohol. Pt states his last drink was 3 days ago, he consumed 4 shots of vodka. Pt is A&Ox4. Pt has no physical complaints at this time. Pt is exhibiting no tremors. Pt denies HI and Si. Pt denies hallucinations.

## 2021-09-14 NOTE — Telephone Encounter (Signed)
Pt. And sister-in-law report pt. Is seeking alcohol rehab. Has had rehab in the past. Had stopped drinking in the past and had a seizure. Appointment made. OK per Melissa in the practice.Instructed pt. To go to ED today for any signs of withdrawal. Verbalizes understanding.

## 2021-09-14 NOTE — ED Provider Notes (Addendum)
Emergency Medicine Provider Triage Evaluation Note  Jeffrey Yates , a 70 y.o. male  was evaluated in triage.  Pt history of alcoholism and prior withdrawal seizure, 4 years ago had to be hospitalized in [place].  Patient reports about a week ago he started drinking heavily again, last drink was 3 days ago on Friday.  Currently he is not experiencing any tremors, palpitations, chest pain, headache, seizures, nausea or vomiting, no hallucinations.  He reports that his family spoke with a doctor in [place] that is trying to get him into an alcohol treatment program and he has an appointment for what he thinks is intake for this program on Wednesday and was told that it would help speed along the process if he came to [facility] and let us "do out thing" but patient is not sure exactly what this entails.  Review of Systems  Positive: Alcohol use Negative: Fever, chills, tremors, palpitations, chest pain, shortness of breath, abdominal pain, vomiting.  Seizures  Physical Exam  BP (!) 144/100   Pulse 92   Temp 98.2 F (36.8 C) (Oral)   Resp 18   SpO2 99%  Gen:   Awake, no distress   Resp:  Normal effort  MSK:   Moves extremities without difficulty  Other:    Medical Decision Making  Medically screening exam initiated at 3:40 PM.  Appropriate orders placed.  Jeffrey Yates was informed that the remainder of the evaluation will be completed by another provider, this initial triage assessment does not replace that evaluation, and the importance of remaining in the ED until their evaluation is complete.  Sounds like patient was sent for medical clearance in anticipation of intake appointment for treatment on Wednesday.  Medical screening labs ordered.  Patient does not currently have any signs or symptoms suggestive of acute alcohol withdrawal requiring medical admission.       Jacqlyn Larsen, PA-C 09/14/21 1546    Lacretia Leigh, MD 09/16/21 [phone]

## 2021-09-15 ENCOUNTER — Encounter: Payer: Self-pay | Admitting: Family Medicine

## 2021-09-15 ENCOUNTER — Ambulatory Visit (INDEPENDENT_AMBULATORY_CARE_PROVIDER_SITE_OTHER): Payer: Medicare HMO | Admitting: Family Medicine

## 2021-09-15 ENCOUNTER — Telehealth: Payer: Self-pay | Admitting: *Deleted

## 2021-09-15 VITALS — BP 116/78 | HR 91 | Temp 98.4°F | Resp 16 | Ht 71.0 in | Wt 178.5 lb

## 2021-09-15 DIAGNOSIS — F1010 Alcohol abuse, uncomplicated: Secondary | ICD-10-CM | POA: Diagnosis not present

## 2021-09-15 DIAGNOSIS — R69 Illness, unspecified: Secondary | ICD-10-CM | POA: Diagnosis not present

## 2021-09-15 DIAGNOSIS — F1020 Alcohol dependence, uncomplicated: Secondary | ICD-10-CM | POA: Diagnosis not present

## 2021-09-15 MED ORDER — THIAMINE HCL 100 MG PO TABS: 100.0000 mg | ORAL_TABLET | Freq: Every day | ORAL | 0 refills | Status: AC

## 2021-09-15 NOTE — Chronic Care Management (AMB) (Signed)
  Chronic Care Management   Outreach Note  09/15/2021 Name: Jeffrey Yates MRN: [id number] DOB: Sep 02, 1951  Jeffrey Yates is a 71 y.o. year old male who is a primary care patient of Hubbard Hartshorn, FNP. I reached out to Jeffrey Yates by phone today in response to a referral sent by Jeffrey Yates [facility] primary care provider.  An unsuccessful telephone outreach was attempted today. The patient was referred to the case management team for assistance with care management and care coordination.   Follow Up Plan: A HIPAA compliant phone message was left for the patient providing contact information and requesting a return call.  If patient returns call to provider office, please advise to call Embedded Care Management Care Guide Thurston Brendlinger at [place], [place] Management  Direct Dial: [phone]

## 2021-09-15 NOTE — Assessment & Plan Note (Signed)
Congratulated efforts to quit. Referred to social work to help with rehab resources. Rx thiamine. Discussed naltrexone but given lack of current cravings, patient wants to try rehab first. F/u in 1-2 months.

## 2021-09-15 NOTE — Progress Notes (Signed)
    SUBJECTIVE:   CHIEF COMPLAINT / HPI:   Alcohol Use Disorder - Daily use of vodka, at least 1/5th per day.  - recent ED visit yesterday to hopefully get established with rehab program sooner. Labs drawn, total bili slightly elevated, otherwise wnl.  - prior history of withdrawal with prior seizure, 4 years ago in [place] - last drink Friday, vodka 1/5th. - no current symptoms of withdrawal. Denies current cravings. - looking into rehab facilities, would like help to see what insurance will cover.   OBJECTIVE:   BP 116/78   Pulse 91   Temp 98.4 F (36.9 C)   Resp 16   Ht 5\' 11"  (1.803 m)   Wt 178 lb 8 oz (81 kg)   SpO2 99%   BMI 24.90 kg/m   Gen: well appearing, in NAD Card: RRR Lungs: CTAB Ext: WWP, no edema   ASSESSMENT/PLAN:   Alcohol use disorder, severe, dependence (HCC) Congratulated efforts to quit. Referred to social work to help with rehab resources. Rx thiamine. Discussed naltrexone but given lack of current cravings, patient wants to try rehab first. F/u in 1-2 months.     Myles Gip, DO

## 2021-09-15 NOTE — Patient Instructions (Signed)
It was great to see you!  Our plans for today:  - If you don't hear from our social worker in a few days, let us know.  - Take the thiamine daily.   Take care and seek immediate care sooner if you develop any concerns.   Dr. Ky Barban

## 2021-09-16 NOTE — Chronic Care Management (AMB) (Signed)
  Chronic Care Management   Note  09/16/2021 Name: Jeffrey Yates MRN: [id number] DOB: [birth year]  Jeffrey Yates is a 70 y.o. year old male who is a primary care patient of Hubbard Hartshorn, FNP. I reached out to [name] by phone today in response to a referral sent by [name] [facility] PCP.  Mr. Anger was given information about Chronic Care Management services today including:  CCM service includes personalized support from designated clinical staff supervised by his physician, including individualized plan of care and coordination with other care providers 24/7 contact phone numbers for assistance for urgent and routine care needs. Service will only be billed when office clinical staff spend 20 minutes or more in a month to coordinate care. Only one practitioner may furnish and bill the service in a calendar month. The patient may stop CCM services at any time (effective at the end of the month) by phone call to the office staff. The patient is responsible for co-pay (up to 20% after annual deductible is met) if co-pay is required by the individual health plan.   Patient agreed to services and verbal consent obtained.   Follow up plan: Telephone appointment with care management team member scheduled for: 09/21/2021  Julian Hy, [place], [place] Management  Direct Dial: [phone]

## 2021-09-21 ENCOUNTER — Ambulatory Visit (INDEPENDENT_AMBULATORY_CARE_PROVIDER_SITE_OTHER): Payer: Medicare HMO | Admitting: *Deleted

## 2021-09-21 DIAGNOSIS — F1010 Alcohol abuse, uncomplicated: Secondary | ICD-10-CM

## 2021-09-21 DIAGNOSIS — I10 Essential (primary) hypertension: Secondary | ICD-10-CM

## 2021-09-21 DIAGNOSIS — R413 Other amnesia: Secondary | ICD-10-CM

## 2021-09-22 ENCOUNTER — Ambulatory Visit: Payer: Self-pay | Admitting: *Deleted

## 2021-09-22 DIAGNOSIS — F1010 Alcohol abuse, uncomplicated: Secondary | ICD-10-CM

## 2021-09-22 DIAGNOSIS — I10 Essential (primary) hypertension: Secondary | ICD-10-CM

## 2021-09-22 DIAGNOSIS — R413 Other amnesia: Secondary | ICD-10-CM

## 2021-09-22 NOTE — Patient Instructions (Signed)
Visit Information   Goals Addressed             This Visit's Progress    Prevent a Relapse-Substance Misuse       Timeframe:  Long-Range Goal Priority:  High Start Date:   09/21/21                          Expected End Date:   03/22/21                    Follow Up Date 09/28/21   - avoid negative self-talk - be honest with myself - begin personal counseling - identify triggers for relapse - join AA (Alcoholics Anonymous) or NA (Narcotics Anonymous) - make a plan to deal with triggers like holidays and anniversaries  -schedule intake assessment with program of [facility] [phone]   Why is this important?   It is possible that after you stop drinking or using drugs that you may slip and use again. This is called a relapse.  A relapse does not mean that you have failed. It means you have had one or a few bad days.  Make sure you know why it is important for you to stay sober and remind yourself often.  Once you figure out what triggered the relapse and take the steps to deal with it, you can be even more hopeful that recovery is possible.     Notes:         The patient verbalized understanding of instructions, educational materials, and care plan provided today and declined offer to receive copy of patient instructions, educational materials, and care plan.   Telephone follow up appointment with care management team member scheduled for: 09/28/21  Elliot Gurney, [place] Worker  [facility] Care Management [phone]

## 2021-09-22 NOTE — Chronic Care Management (AMB) (Cosign Needed)
Chronic Care Management    Clinical Social Work Note  09/22/2021 Name: Jeffrey Yates MRN: [id number] DOB: [birth year]  Jeffrey Yates is a 70 y.o. year old male who is a primary care patient of Hubbard Hartshorn, FNP. The CCM team was consulted to assist the patient with chronic disease management and/or care coordination needs related to: Mental Health Counseling and Resources.   Engaged with patient and patient's spouse by telephone for initial visit in response to provider referral for social work chronic care management and care coordination services.   Consent to Services:  The patient was given the following information about Chronic Care Management services today, agreed to services, and gave verbal consent: 1. CCM service includes personalized support from designated clinical staff supervised by the primary care provider, including individualized plan of care and coordination with other care providers 2. 24/7 contact phone numbers for assistance for urgent and routine care needs. 3. Service will only be billed when office clinical staff spend 20 minutes or more in a month to coordinate care. 4. Only one practitioner may furnish and bill the service in a calendar month. 5.The patient may stop CCM services at any time (effective at the end of the month) by phone call to the office staff. 6. The patient will be responsible for cost sharing (co-pay) of up to 20% of the service fee (after annual deductible is met). Patient agreed to services and consent obtained.  Patient agreed to services and consent obtained.   Assessment: Review of patient past medical history, allergies, medications, and health status, including review of relevant consultants reports was performed today as part of a comprehensive evaluation and provision of chronic care management and care coordination services.     SDOH (Social Determinants of Health) assessments and interventions performed:  SDOH Interventions    Flowsheet  Row Most Recent Value  SDOH Interventions   SDOH Interventions for the Following Domains Alcohol Usage  Alcohol Brief Interventions/Follow-up Alcohol education/Brief advice  [will refer for outpatient substance abuse counseling]        Advanced Directives Status: Not addressed in this encounter.  CCM Care Plan  Allergies  Allergen Reactions   Aricept [Donepezil Hcl] Rash   Namenda [Memantine Hcl] Rash    Outpatient Encounter Medications as of 09/21/2021  Medication Sig   ascorbic acid (VITAMIN C) 1000 MG tablet Take 1,000 mg by mouth daily.   Cholecalciferol (VITAMIN D3) 1000 UNITS CAPS Take 1 capsule by mouth.   dutasteride (AVODART) 0.5 MG capsule Take 0.5 mg by mouth daily.   metoprolol succinate (TOPROL-XL) 100 MG 24 hr tablet Take 1 tablet (100 mg total) by mouth daily.   mometasone (ELOCON) 0.1 % cream Apply 1 application topically daily. As needed for flares, use sparingly   tamsulosin (FLOMAX) 0.4 MG CAPS capsule Take 0.4 mg by mouth daily.   thiamine 100 MG tablet Take 1 tablet (100 mg total) by mouth daily.   traZODone (DESYREL) 50 MG tablet TAKE 1 TABLET(50 MG) BY MOUTH AT BEDTIME AS NEEDED FOR SLEEP   vitamin B-12 (CYANOCOBALAMIN) 1000 MCG tablet Take 1,000 mcg by mouth daily.   No facility-administered encounter medications on file as of 09/21/2021.    Patient Active Problem List   Diagnosis Date Noted   Tubular adenoma of colon 07/23/2020   Memory difficulties 01/10/2019   Gilbert's disease 05/17/2018   Insomnia 05/15/2018   Neuralgia neuritis, sciatic nerve 06/29/2016   Dupuytren's contracture 05/31/2016   Snoring 05/26/2016   Gout 09/22/2015  Psoriasis 09/22/2015   Hypertension 07/14/2015   Hyperlipidemia 07/14/2015   Alcohol use disorder, severe, dependence ([place]) 07/14/2015   Allergic rhinitis 07/14/2015   Benign non-nodular prostatic hyperplasia without lower urinary tract symptoms 11/26/2014    Conditions to be addressed/monitored: HTN and  substance misuse ; Substance abuse issues -  alcohol misuse  Care Plan : Substance Misuse (Adult)  Updates made by Vern Claude, LCSW since 09/22/2021 12:00 AM     Problem: Relapse Risk      Goal: Relapse Risk Minimized   Start Date: 09/21/2021  Priority: High  Note:   Current Barriers:  Substance misuse Substance abuse issues -  alcohol misuse Suicidal Ideation/Homicidal Ideation: No  Clinical Social Work Goal(s):  Over the next 90 days, patient will work with SW bi-weekly by telephone or in person to maintain abstinence from alcohol use  Interventions: Patient interviewed and appropriate assessments performed: PHQ 2 PHQ 9 SDOH Interventions    Flowsheet Row Most Recent Value  SDOH Interventions   SDOH Interventions for the Following Domains Alcohol Usage  Alcohol Brief Interventions/Follow-up Alcohol education/Brief advice  [will refer for outpatient substance abuse counseling]     Patient and spouse discussed alcohol misuse, however patient confirms abstinence from alcohol use  for the last 2 weeks, no withdrawals experienced Confirmed that patient is retired, no consistent daily routine, ambition to participate in outside activities has decreased Patient confirms motivation to begin a recovery plan, has history of court ordered outpatient treatment approximately 4 years ago [place] program recommended to support and reinforce coping skills-patient agreeable for this social worker to assist with coordinating care for alcohol misuse treatment services [facility] Cherylynn [place] main contact person [phone] program runs 3 days per week  Monday-Wednesday-Friday  1-4pm [place] contacted [place] program explored-groups run Monday-Wednesday-Friday 10:30am-1:30pm, 6:30-9:00pm, 12-2pm or 6pm-8pm Patient will need to complete intake assessment before start of the program and recommendations will be made at this time Involvement in [place] encouraged  and daily excercise Active listening / Reflection utilized  [place] strategies reviewed Participation in counseling encouraged  Participation in support group encouraged  Increase in actives / exercise encouraged   Patient Self Care Activities:  Motivation for treatment Strong family or social support  Patient Coping Strengths:  Family Able to Communicate Effectively  Patient Self Care Deficits:  Knowledge deficit of in-network outpatient recovery programs  Initial goal documentation        Follow Up Plan: SW will follow up with patient by phone over the next 7-14 business days      [place], [place] Worker  [facility] [phone]

## 2021-09-22 NOTE — Chronic Care Management (AMB) (Cosign Needed)
Chronic Care Management    Clinical Social Work Note  09/22/2021 Name: Jeffrey Yates MRN: [id number] DOB: 27-Jul-1951  Jeffrey Yates is a 70 y.o. year old male who is a primary care patient of Hubbard Hartshorn, FNP. The CCM team was consulted to assist the patient with chronic disease management and/or care coordination needs related to: Mental Health Counseling and Resources.   Collaboration with patient's spouse  for follow up visit to provide needed resources for substance misuse treatment services in response to provider referral for social work chronic care management and care coordination services.   Consent to Services:  The patient was given information about Chronic Care Management services, agreed to services, and gave verbal consent prior to initiation of services.  Please see initial visit note for detailed documentation.   Patient agreed to services and consent obtained.   Assessment: Review of patient past medical history, allergies, medications, and health status, including review of relevant consultants reports was performed today as part of a comprehensive evaluation and provision of chronic care management and care coordination services.     SDOH (Social Determinants of Health) assessments and interventions performed:    Advanced Directives Status: Not addressed in this encounter.  CCM Care Plan  Allergies  Allergen Reactions   Aricept [Donepezil Hcl] Rash   Namenda [Memantine Hcl] Rash    Outpatient Encounter Medications as of 09/22/2021  Medication Sig   ascorbic acid (VITAMIN C) 1000 MG tablet Take 1,000 mg by mouth daily.   Cholecalciferol (VITAMIN D3) 1000 UNITS CAPS Take 1 capsule by mouth.   dutasteride (AVODART) 0.5 MG capsule Take 0.5 mg by mouth daily.   metoprolol succinate (TOPROL-XL) 100 MG 24 hr tablet Take 1 tablet (100 mg total) by mouth daily.   mometasone (ELOCON) 0.1 % cream Apply 1 application topically daily. As needed for flares, use sparingly    tamsulosin (FLOMAX) 0.4 MG CAPS capsule Take 0.4 mg by mouth daily.   thiamine 100 MG tablet Take 1 tablet (100 mg total) by mouth daily.   traZODone (DESYREL) 50 MG tablet TAKE 1 TABLET(50 MG) BY MOUTH AT BEDTIME AS NEEDED FOR SLEEP   vitamin B-12 (CYANOCOBALAMIN) 1000 MCG tablet Take 1,000 mcg by mouth daily.   No facility-administered encounter medications on file as of 09/22/2021.    Patient Active Problem List   Diagnosis Date Noted   Tubular adenoma of colon 07/23/2020   Memory difficulties 01/10/2019   Gilbert's disease 05/17/2018   Insomnia 05/15/2018   Neuralgia neuritis, sciatic nerve 06/29/2016   Dupuytren's contracture 05/31/2016   Snoring 05/26/2016   Gout 09/22/2015   Psoriasis 09/22/2015   Hypertension 07/14/2015   Hyperlipidemia 07/14/2015   Alcohol use disorder, severe, dependence ([place]) 07/14/2015   Allergic rhinitis 07/14/2015   Benign non-nodular prostatic hyperplasia without lower urinary tract symptoms 11/26/2014    Conditions to be addressed/monitored: HTN and substance misuse ; Substance abuse issues -  alcohol misuse  Care Plan : Substance Misuse (Adult)  Updates made by Vern Claude, LCSW since 09/22/2021 12:00 AM     Problem: Relapse Risk      Goal: Relapse Risk Minimized   Start Date: 09/21/2021  Priority: High  Note:   Current Barriers:  Substance misuse Substance abuse issues -  alcohol misuse Suicidal Ideation/Homicidal Ideation: No  Clinical Social Work Goal(s):  Over the next 90 days, patient will work with SW bi-weekly by telephone or in person to maintain abstinence from alcohol use  Interventions: Patient interviewed  and appropriate assessments performed: PHQ 2 PHQ 9 SDOH Interventions    Flowsheet Row Most Recent Value  SDOH Interventions   SDOH Interventions for the Following Domains Alcohol Usage  Alcohol Brief Interventions/Follow-up Alcohol education/Brief advice  [will refer for outpatient substance abuse  counseling]     Patient and spouse discussed alcohol misuse, however patient confirms abstinence from alcohol use  for the last 2 weeks, no withdrawals experienced Confirmed that patient is retired, no consistent daily routine, ambition to participate in outside activities has decreased Patient confirms motivation to begin a recovery plan, has history of court ordered outpatient treatment approximately 4 years ago [place] program recommended to support and reinforce coping skills-patient agreeable for this social worker to assist with coordinating care for alcohol misuse treatment services [facility] main contact person [phone] program runs 3 days per week  Monday-Wednesday-Friday  1-4pm [place] contacted [facility] program explored-groups run Monday-Wednesday-Friday 10:30am-1:30pm, 6:30-9:00pm, 12-2pm or 6pm-8pm Patient will need to complete intake assessment before start of the program and recommendations will be made at this time Involvement in AA encouraged and daily excercise Active listening / Reflection utilized  Problem [place] strategies reviewed Participation in counseling encouraged  Participation in support group encouraged  Increase in actives / exercise encouraged  09/22/21 Follow up phone call to patient's spouse to provide resource information for in network CDIOP programs-contact information provided-patient's spouse agrees to contact program of choice to schedule initial intake-specific treatment recommendations will be made at that time-patient's spouse agreeable to call to schedule intake.  Patient Self Care Activities:  Motivation for treatment Strong family or social support  Patient Coping Strengths:  Family Able to Communicate Effectively  Patient Self Care Deficits:  Knowledge deficit of in-network outpatient recovery programs  Initial goal documentation        Follow Up Plan: SW will follow up  with patient by phone over the next 7-14 business days      [place], [place] Worker  [facility] Care [facility] [phone]

## 2021-09-22 NOTE — Patient Instructions (Signed)
Visit Information  PATIENT GOALS:   Goals Addressed             This Visit's Progress    Prevent a Relapse-Substance Misuse       Timeframe:  Long-Range Goal Priority:  High Start Date:   09/21/21                          Expected End Date:   03/22/21                    Follow Up Date 10/172/22    - avoid negative self-talk - be honest with myself - begin personal counseling - identify triggers for relapse - join AA (Alcoholics Anonymous) or NA (Narcotics Anonymous) - make a plan to deal with triggers like holidays and anniversaries    Why is this important?   It is possible that after you stop drinking or using drugs that you may slip and use again. This is called a relapse.  A relapse does not mean that you have failed. It means you have had one or a few bad days.  Make sure you know why it is important for you to stay sober and remind yourself often.  Once you figure out what triggered the relapse and take the steps to deal with it, you can be even more hopeful that recovery is possible.     Notes:         Mr. Logan was given information about Care Management services by the embedded care coordination team including:  Care Management services include personalized support from designated clinical staff supervised by his physician, including individualized plan of care and coordination with other care providers 24/7 contact phone numbers for assistance for urgent and routine care needs. The patient may stop CCM services at any time (effective at the end of the month) by phone call to the office staff.  Patient agreed to services and verbal consent obtained.   The patient verbalized understanding of instructions, educational materials, and care plan provided today and declined offer to receive copy of patient instructions, educational materials, and care plan.   Telephone follow up appointment with care management team member scheduled for: 09/22/21  Elliot Gurney,  [place] Worker  [facility] [phone]

## 2021-09-23 ENCOUNTER — Telehealth (HOSPITAL_COMMUNITY): Payer: Self-pay | Admitting: Licensed Clinical Social Worker

## 2021-09-25 ENCOUNTER — Ambulatory Visit (HOSPITAL_COMMUNITY): Payer: Self-pay | Admitting: Licensed Clinical Social Worker

## 2021-09-28 ENCOUNTER — Ambulatory Visit: Payer: Medicare HMO | Admitting: *Deleted

## 2021-09-28 DIAGNOSIS — F1010 Alcohol abuse, uncomplicated: Secondary | ICD-10-CM

## 2021-09-28 DIAGNOSIS — R413 Other amnesia: Secondary | ICD-10-CM

## 2021-09-28 DIAGNOSIS — I10 Essential (primary) hypertension: Secondary | ICD-10-CM

## 2021-09-28 NOTE — Patient Instructions (Addendum)
Visit Information   Goals Addressed             This Visit's Progress    Prevent a Relapse-Substance Misuse       Timeframe:  Long-Range Goal Priority:  High Start Date:   09/21/21                          Expected End Date:   03/22/21                    Follow Up Date 10/02/21   - avoid negative self-talk - be honest with myself - begin personal counseling - identify triggers for relapse - join AA (Alcoholics Anonymous) or NA (Narcotics Anonymous) - make a plan to deal with triggers like holidays and anniversaries  -schedule intake assessment with program of [facility] [phone]   Why is this important?   It is possible that after you stop drinking or using drugs that you may slip and use again. This is called a relapse.  A relapse does not mean that you have failed. It means you have had one or a few bad days.  Make sure you know why it is important for you to stay sober and remind yourself often.  Once you figure out what triggered the relapse and take the steps to deal with it, you can be even more hopeful that recovery is possible.     Notes:         The patient verbalized understanding of instructions, educational materials, and care plan provided today and declined offer to receive copy of patient instructions, educational materials, and care plan.   Telephone follow up appointment with care management team member scheduled for: 10/01/21  Elliot Gurney, [place] Worker  [facility] [phone]

## 2021-09-28 NOTE — Chronic Care Management (AMB) (Cosign Needed)
Chronic Care Management    Clinical Social Work Note  09/28/2021 Name: Jeffrey Yates MRN: [id number] DOB: 05-19-1951  Jeffrey Yates is a 70 y.o. year old male who is a primary care patient of Hubbard Hartshorn, FNP. The CCM team was consulted to assist the patient with chronic disease management and/or care coordination needs related to: Mental Health Counseling and Resources.   Collaboration with patient's spouse  for follow up visit in response to provider referral for social work chronic care management and care coordination services.   Consent to Services:  The patient was given information about Chronic Care Management services, agreed to services, and gave verbal consent prior to initiation of services.  Please see initial visit note for detailed documentation.   Patient agreed to services and consent obtained.   Assessment: Review of patient past medical history, allergies, medications, and health status, including review of relevant consultants reports was performed today as part of a comprehensive evaluation and provision of chronic care management and care coordination services.     SDOH (Social Determinants of Health) assessments and interventions performed:    Advanced Directives Status: Not addressed in this encounter.  CCM Care Plan  Allergies  Allergen Reactions   Aricept [Donepezil Hcl] Rash   Namenda [Memantine Hcl] Rash    Outpatient Encounter Medications as of 09/28/2021  Medication Sig   ascorbic acid (VITAMIN C) 1000 MG tablet Take 1,000 mg by mouth daily.   Cholecalciferol (VITAMIN D3) 1000 UNITS CAPS Take 1 capsule by mouth.   dutasteride (AVODART) 0.5 MG capsule Take 0.5 mg by mouth daily.   metoprolol succinate (TOPROL-XL) 100 MG 24 hr tablet Take 1 tablet (100 mg total) by mouth daily.   mometasone (ELOCON) 0.1 % cream Apply 1 application topically daily. As needed for flares, use sparingly   tamsulosin (FLOMAX) 0.4 MG CAPS capsule Take 0.4 mg by mouth  daily.   thiamine 100 MG tablet Take 1 tablet (100 mg total) by mouth daily.   traZODone (DESYREL) 50 MG tablet TAKE 1 TABLET(50 MG) BY MOUTH AT BEDTIME AS NEEDED FOR SLEEP   vitamin B-12 (CYANOCOBALAMIN) 1000 MCG tablet Take 1,000 mcg by mouth daily.   No facility-administered encounter medications on file as of 09/28/2021.    Patient Active Problem List   Diagnosis Date Noted   Tubular adenoma of colon 07/23/2020   Memory difficulties 01/10/2019   Gilbert's disease 05/17/2018   Insomnia 05/15/2018   Neuralgia neuritis, sciatic nerve 06/29/2016   Dupuytren's contracture 05/31/2016   Snoring 05/26/2016   Gout 09/22/2015   Psoriasis 09/22/2015   Hypertension 07/14/2015   Hyperlipidemia 07/14/2015   Alcohol use disorder, severe, dependence ([place]) 07/14/2015   Allergic rhinitis 07/14/2015   Benign non-nodular prostatic hyperplasia without lower urinary tract symptoms 11/26/2014    Conditions to be addressed/monitored: ;Substance abuse issues -  alcohol misuse  Care Plan : Substance Misuse (Adult)  Updates made by Vern Claude, LCSW since 09/28/2021 12:00 AM     Problem: Relapse Risk      Goal: Relapse Risk Minimized   Start Date: 09/21/2021  This Visit's Progress: Not on track  Priority: High  Note:   Current Barriers:  Substance misuse Substance abuse issues -  alcohol misuse Suicidal Ideation/Homicidal Ideation: No  Clinical Social Work Goal(s):  Over the next 90 days, patient will work with SW bi-weekly by telephone or in person to maintain abstinence from alcohol use  Interventions: SDOH Interventions    [place] Most Recent  Value  SDOH Interventions   SDOH Interventions for the Following Domains Alcohol Usage  Alcohol Brief Interventions/Follow-up Alcohol education/Brief advice  [will refer for outpatient substance abuse counseling]     Patient and spouse confirmed that patient has relapsed and did not follow through with the [facility]  referral Confirmed that patient's spouse made contact with the program, however patient did not make himself available for the required screening before he could start the program [facility] program willing to work with patient if patient is agreeable, spouse to contact patient today to discuss plan to re-schedule assessment Emotional support provided to spouse Active listening / Reflection utilized  Problem [place] strategies reviewed   Plan to follow up with patient next week   Patient Self Care Activities:  Motivation for treatment Strong family or social support  Patient Coping Strengths:  Family Able to Communicate Effectively  Patient Self Care Deficits:  Knowledge deficit of in-network outpatient recovery programs  Please see past updates related to this goal by clicking on the "Past Updates" button in the selected goal         Follow Up Plan: SW will follow up with patient by phone over the next 5-7 business days      Elliot Gurney, [place] Worker  [facility] [phone]

## 2021-09-29 ENCOUNTER — Encounter: Payer: Self-pay | Admitting: *Deleted

## 2021-09-29 ENCOUNTER — Ambulatory Visit: Payer: Self-pay | Admitting: *Deleted

## 2021-09-29 ENCOUNTER — Telehealth: Payer: Self-pay | Admitting: *Deleted

## 2021-09-29 ENCOUNTER — Telehealth: Payer: Self-pay

## 2021-09-29 ENCOUNTER — Ambulatory Visit (INDEPENDENT_AMBULATORY_CARE_PROVIDER_SITE_OTHER): Payer: Self-pay | Admitting: Licensed Clinical Social Worker

## 2021-09-29 ENCOUNTER — Other Ambulatory Visit: Payer: Self-pay

## 2021-09-29 DIAGNOSIS — F1020 Alcohol dependence, uncomplicated: Secondary | ICD-10-CM

## 2021-09-29 DIAGNOSIS — R413 Other amnesia: Secondary | ICD-10-CM

## 2021-09-29 DIAGNOSIS — F1010 Alcohol abuse, uncomplicated: Secondary | ICD-10-CM

## 2021-09-29 DIAGNOSIS — I10 Essential (primary) hypertension: Secondary | ICD-10-CM

## 2021-09-29 NOTE — Progress Notes (Deleted)
Last drinking Monday 1 wk ago [employer] Pint-fifth or up to half gallon (3 days) 8-10 years denies w/d sx Early days gradually going up Part of increase was "getting home off the road and stopping at home" [place] couple years ago; Retail buyer drinking and drive Wife been after me to get some help "if its there I drink it, I dont drink hers" Blackout; dont remember what/how I say things; say things drinking harsh   Retired last April 2021 Wife "shes the reason I'm here"  Last time sober before this week: never more than a week top Hobbies: play golf hasnt been for more than 1 month d/t drinking  Maternal aunt/uncle etoh Paternal uncle etoh  Support is wife; not seeing golf friends previously 4-5 times weekly Since retired got otu of [place]  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 Chart hx alcohol abuse in paternal grandfather PCP dr Ky Barban at [facility] Per pcp note drinking around a 1/5th of vodka daily   09/14/2021: [facility] medical screening for detox from alcohol due to history of withdrawal seizures (reportedly 4 yrs ago [facility]) Client and wife reported last drink was  09/21/21 when client had a fall and hit his head (visible bruising on right side of head and multiple buises on arms clt reports from running into things and having poor balance while drinking) Clt denied history of treatment for alcohol use outside of the ED however per chart was seen at this center in 2016 for outpatient therapy for 3 months. 11/25/2014 note from [facility] seen for seizure (acute alcohol withdrawal) was given narcan after found unconscious episode. At the time reported 5-6 mixed drinks daily but had not drank for 3 days while on business trip.

## 2021-09-29 NOTE — Telephone Encounter (Signed)
Copied from [place] [id number]. Topic: General - Other >> Sep 29, 2021 11:17 AM [place] wrote: Reason for CRM: Pt wants appt with Sococial worker for 1019 cancelled, states did in in contact with [place] and does not need phone appt tomorrow.

## 2021-09-29 NOTE — Progress Notes (Signed)
This encounter was created in error - please disregard.

## 2021-09-29 NOTE — Telephone Encounter (Signed)
This encounter was created in error - please disregard.

## 2021-09-29 NOTE — Addendum Note (Signed)
Addended by: Vern Claude on: 09/29/2021 01:20 PM   Modules accepted: Orders, Level of Service, SmartSet

## 2021-09-29 NOTE — Telephone Encounter (Deleted)
   09/29/2021  Jeffrey Yates 04-03-1951 [id number]    Outreach phone call to patient's spouse, confirmed that patient will start the [place] program on 09/29/21. Patient's spouse verbalized no additional community resource needs at this time. Appointment canceled with this social worker for 10/02/21. Patient/patient's  spouse encouraged to contact this Education officer, museum with any additional needs.  Elliot Gurney, [place] Administrator, [facility] [phone]

## 2021-09-29 NOTE — Progress Notes (Signed)
Comprehensive Clinical Assessment (CCA) Note  09/29/2021 Jeffrey Yates [id number]  Chief Complaint:  Chief Complaint  Patient presents with   Alcohol Problem   Visit Diagnosis: alcohol use disorder, severe    CCA Screening, Triage and Referral (STR)  Patient Reported Information How did you hear about [place]? Other (Comment)  Referral name: wife, Alif Petrak  Referral phone number: [phone]   [place] do you see for routine medical problems? Primary Care  Practice/Facility Name: No data recorded Practice/Facility Phone Number: No data recorded Name of Contact: No data recorded Contact Number: No data recorded Contact Fax Number: No data recorded Prescriber Name: No data recorded Prescriber Address (if known): No data recorded  What Is the Reason for Your Visit/Call Today? referral for cdiop  How Long Has This Been Causing You Problems? > than 6 months  What Do You Feel Would Help You the Most Today? Alcohol or Drug Use Treatment   Have You Recently Been in Any Inpatient Treatment (Hospital/Detox/Crisis Center/28-Day Program)? No (no hx tx program ED)  Name/Location of Program/Hospital:No data recorded How Long Were You There? No data recorded When Were You Discharged? No data recorded  Have You Ever Received Services From [facility] Before? Yes  Who Do You See at [facility]? No data recorded  Have You Recently Had Any Thoughts About Hurting Yourself? No  Are You Planning to Commit Suicide/Harm Yourself At This time? No   Have you Recently Had Thoughts About [place]? No  Explanation: No data recorded  Have You Used Any Alcohol or Drugs in the Past 24 Hours? No  How Long Ago Did You Use Drugs or Alcohol? No data recorded What Did You Use and How Much? No data recorded  Do You Currently Have a Therapist/Psychiatrist? No  Name of Therapist/Psychiatrist: No data recorded  Have You Been Recently Discharged From Any Office Practice or Programs?  No  Explanation of Discharge From Practice/Program: No data recorded    CCA Screening Triage Referral Assessment Type of Contact: No data recorded Is this Initial or Reassessment? No data recorded Date Telepsych consult ordered in [facility]:  No data recorded Time Telepsych consult ordered in [facility]:  No data recorded  Patient Reported Information Reviewed? No data recorded Patient Left Without Being Seen? No data recorded Reason for Not Completing Assessment: No data recorded  Collateral Involvement: No data recorded  Does Patient Have a [place]? No data recorded Name and Contact of Legal Guardian: No data recorded If Minor and Not Living with Parent(s), Who has Custody? No data recorded Is CPS involved or ever been involved? Never  Is APS involved or ever been involved? Never   Patient Determined To Be At Risk for Harm To Self or Others Based on Review of Patient Reported Information or Presenting Complaint? No  Method: No data recorded Availability of Means: No data recorded Intent: No data recorded Notification Required: No data recorded Additional Information for Danger to Others Potential: No data recorded Additional Comments for Danger to Others Potential: No data recorded Are There Guns or Other Weapons in Your Home? No data recorded Types of Guns/Weapons: No data recorded Are These Weapons Safely Secured?                            No data recorded Who Could Verify You Are Able To Have These Secured: No data recorded Do You Have any Outstanding Charges, Pending Court Dates, Parole/Probation?  No data recorded Contacted To Inform of Risk of Harm To Self or Others: No data recorded  Location of Assessment: No data recorded  Does Patient Present under Involuntary Commitment? No data recorded IVC Papers Initial File Date: No data recorded  [place] of Residence: No data recorded  Patient Currently Receiving the Following Services: No data  recorded  Determination of Need: No data recorded  Options For Referral: No data recorded    CCA Biopsychosocial Intake/Chief Complaint:  Client presents for a CDIOP referral at request of wife for alcohol use. Client minimizes use but reports averaging between and pint and a fifth of liquor daily for the past 10 years. Wife confirms this amount and frequency and both cannot identify specific event for starting or continuing to drink. Client has been sober approximatly 1 week. Client reports after retiring having too much free time  Current Symptoms/Problems: Client denies any mental health symtpoms of note to be mindful of memory difficulties, only a problem once durring assessment (clt reports retiring 1 year ago, wife reported 3 years ago)   Patient Reported Schizophrenia/Schizoaffective Diagnosis in Past: No   Strengths: family support  Preferences: "wife's been on me to get some help"  Abilities: No data recorded  Type of Services Patient Feels are Needed: treatment for alcohol   Initial Clinical Notes/Concerns: 09/14/2021: [facility] medical screening for detox from alcohol due to history of withdrawal seizures (reportedly 4 yrs ago [place] [facility]) Client and wife reported last drink was  09/21/21 when client had a fall and hit his head (visible bruising on right side of head and multiple buises on arms clt reports from running into things and having poor balance while drinking)  Clt denied history of treatment for alcohol use outside of the ED however per chart was seen at this center in 2016 for outpatient therapy for 3 months.  11/25/2014 note from [facility] seen for seizure (acute alcohol withdrawal) was given narcan after found unconscious episode. At the time reported 5-6 mixed drinks daily but had not drank for 3 days while on business trip. Retired September 2020 per chart, per client April 2021, per wife 3 years ago.   Mental Health Symptoms Depression:   None  ("pretty consistent in all of the areas" (energy, appetite, sleep))   Duration of Depressive symptoms: No data recorded  Mania:   None   Anxiety:    None   Psychosis:   None   Duration of Psychotic symptoms: No data recorded  Trauma:   None ("father used to put a good one on me but thats been a long time ago")   Obsessions:   None   Compulsions:   None   Inattention:   None   Hyperactivity/Impulsivity:   None   Oppositional/Defiant Behaviors:   None   Emotional Irregularity:   None   Other Mood/Personality Symptoms:  No data recorded   Mental Status Exam Appearance and self-care  Stature:   Average   Weight:   Average weight   Clothing:   Casual; Neat/clean   Grooming:   Well-groomed   Cosmetic use:   Age appropriate   Posture/gait:   Normal   Motor activity:   Not Remarkable   Sensorium  Attention:   Normal   Concentration:   Normal   Orientation:   X5 (should complete full MMSE)   Recall/memory:   Defective in Short-term (my wife and daughter question me)   Affect and Mood  Affect:  Appropriate   Mood:   Euthymic   Relating  Eye contact:  No data recorded  Facial expression:   Responsive   Attitude toward examiner:   Guarded; Cooperative   Thought and Language  Speech flow:  Clear and Coherent   Thought content:   Appropriate to Mood and Circumstances   Preoccupation:   None   Hallucinations:   None   Organization:  No data recorded  [employer] of Knowledge:   Average   Intelligence:   Average   Abstraction:   Functional   Judgement:   Poor (per client not impulsive, if i am ive always got my wife)   Reality Testing:   Adequate   Insight:   Poor   Decision Making:   Normal   Social Functioning  Social Maturity:   Isolates; Impulsive   Social Judgement:   Normal   Stress  Stressors:   Family conflict (wife worried about drinking, youngest daughter (with current wife);  loss: retirment; hx dui; retirement leaving with too much free time)   Coping Ability:   Deficient supports   Skill Deficits:   Interpersonal; Decision making; Self-control   Supports:   Family (wife supportive; spending less time with friends due to drinking)     Religion: Religion/Spirituality Are You A Religious Person?: Yes ("not like i used to be, got out of the habit of going")  Leisure/Recreation: Leisure / Recreation Do You Have Hobbies?: Yes Leisure and Hobbies: golf but has not been recently due to drinking or falling and poor balance  Exercise/Diet: Exercise/Diet Do You Exercise?: Yes (walk a little bit and streching or golf) What Type of Exercise Do You Do?: Run/Walk How Many Times a Week Do You Exercise?: 1-3 times a week Have You Gained or Lost A Significant Amount of Weight in the Past Six Months?: Yes-Lost (weight down from working days (hx 210, current 175)) Do You Follow a Special Diet?: No Do You Have Any Trouble Sleeping?: No (sometimes hard getting up in the morning after the deep sleep; states falls asleep until around 2am, stays up for a bit then falls back asleep)   CCA Employment/Education Employment/Work Situation: Employment / Work Copywriter, 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, government has Been Impacted by Current Illness:  (clt denies however was hospitalized durring work trip due to etoh w/d seizure) What is the Longest Time Patient has Held a Job?: 14 years Where was the Patient Employed at that Time?: 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[employer], 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[employer]; (traveling) Biochemist, clinical for modular office system Has Patient ever Been in the [employer]?: No  Education: Education Is Patient Currently Attending School?: No Last Grade Completed: 14 Name of High School: [employer] Did Teacher, adult education From [employer]?: Yes Did Physicist, medical?: Yes What Type of College Degree Do you Have?: associate Did You Attend Graduate School?: No What Was Your Major?:  business administration Did You Have An Individualized Education Program (IIEP): No Did You Have Any Difficulty At School?: No Patient's Education Has Been Impacted by Current Illness: No   CCA Family/Childhood History Family and Relationship History: Family history Marital status: Married Number of Years Married: 68 What types of issues is patient dealing with in the relationship?: wife worried about drinking; Additional relationship information: 2 daughters by first marriage, "she was having an affair" (17 years) Does patient have children?: Yes How many children?: 3 How is patient's relationship with their children?: youngest daughter worried about drinking; (charlies mom [place]) sees  regularly; daughter Gerald Stabs shows up when she wants something;  Childhood History:  Childhood History By whom was/is the patient raised?: Both parents Additional childhood history information: primarily raised by mom, dad was truck driver so he was gone often (etoh: maternal aunt/uncle, paternal uncle, paternal grandfather) Description of patient's relationship with caregiver when they were a child: mom: got along pretty good; dad: a little more confrontational very athuritarian Patient's description of current relationship with people who raised him/her: GEZ:MOQHUTMLY; mom: dont see often How were you disciplined when you got in trouble as a child/adolescent?: dad physically disciplined; mom: switch or grounding Does patient have siblings?: Yes Number of Siblings: 3 Description of patient's current relationship with siblings: brother 14 months younger; brother ray 76 years younger, sister patsy same; got along well growing up; talk with one Ulice Dash, closest brother) Did patient suffer any verbal/emotional/physical/sexual abuse as a child?: No Did patient suffer from severe childhood neglect?: No Has patient ever been sexually abused/assaulted/raped as an adolescent or adult?: No Was the patient ever a  victim of a crime or a disaster?: No Witnessed domestic violence?: No Has patient been affected by domestic violence as an adult?: No  Child/Adolescent Assessment:     CCA Substance Use Alcohol/Drug Use: Alcohol / Drug Use Pain Medications: see MAR Prescriptions: see MAR Over the Counter: see MAR History of alcohol / drug use?: Yes (reported drinking had to do with working "getting home off the road and stopping at the store on the way home" Clt reported around 10 years ago "hooked up with some friends" when trying other substances) Longest period of sobriety (when/how long): 1 week Negative Consequences of Use: Legal, Personal relationships (hx dui; wife and daughter worried, "if its there, i drink it" clt reports damaging relationship with wife due to not remembering what he says/how he says hurtful things to his wife.) Withdrawal Symptoms: None (none current; hx change in BP, nausea, seizure,) Substance #1 Name of Substance 1: alcohol 1 - Age of First Use: [age] (heavy daily use 8-10 years) 1 - Amount (size/oz): 1pint- one 5th of liquor (up to a half gallon) 1 - Frequency: daily 1 - Duration: 8-10 years current heavy/daily drinking 1 - Last Use / Amount: 09/21/21 1 - Method of Aquiring: store bought 1- Route of Use: oral/drink Substance #2 Name of Substance 2: THC 2 - Age of First Use: 14 2 - Amount (size/oz): 1 bowl 2 - Frequency: 2-3 times per wk 2 - Duration: 10 years 2 - Last Use / Amount: more than 1 month 2 - Method of Aquiring: bought 2 - Route of Substance Use: smoke Substance #3 Name of Substance 3: cocaine 3 - Age of First Use: highschool 3 - Amount (size/oz): couple lines 3 - Frequency: couple times week 3 - Duration: few years 3 - Last Use / Amount: early 68s 3 - Method of Aquiring: neighbor 3 - Route of Substance Use: snort Substance #4 Name of Substance 4: nicotine 4 - Age of First Use: teens 4 - Amount (size/oz): 2 cigarrettes 4 - Frequency: daily 4  - Duration: ongoing 4 - Last Use / Amount: 09/29/21 4 - Method of Aquiring: store bought 4 - Route of Substance Use: smoke                 ASAM's:  Six Dimensions of Multidimensional Assessment  Dimension 1:  Acute Intoxication and/or Withdrawal Potential:   Dimension 1:  Description of individual's past and current experiences of substance use and  withdrawal: no use 1 week; max 3 weeks no drinking in past 10 years  Dimension 2:  Biomedical Conditions and Complications:   Dimension 2:  Description of patient's biomedical conditions and  complications: "old age" high blood pressure, memory trouble  Dimension 3:  Emotional, Behavioral, or Cognitive Conditions and Complications:  Dimension 3:  Description of emotional, behavioral, or cognitive conditions and complications: low screening for anxiety/depression; states cannot drive by a liquor store without going in.  Dimension 4:  Readiness to Change:  Dimension 4:  Description of Readiness to Change criteria: minimizes effect of alcohol use; states wanting to do better by his wife is reasons for attending treatment  Dimension 5:  Relapse, Continued use, or Continued Problem Potential:  Dimension 5:  Relapse, continued use, or continued problem potential critiera description: hx ED w/d sx and detox, able to stay sober up to 3 weeks;  Dimension 6:  Recovery/Living Environment:  Dimension 6:  Recovery/Iiving environment criteria description: wife supportive; clt states will not drink wife's alcohol, currently reported not being able to drive by abc store without stopping  ASAM Severity Score: ASAM's Severity Rating Score: 12  ASAM Recommended Level of Treatment: ASAM Recommended Level of Treatment: Level II Intensive Outpatient Treatment   Substance use Disorder (SUD) Substance Use Disorder (SUD)  Checklist Symptoms of Substance Use: Continued use despite having a persistent/recurrent physical/psychological problem caused/exacerbated by use,  Continued use despite persistent or recurrent social, interpersonal problems, caused or exacerbated by use, Evidence of tolerance, Large amounts of time spent to obtain, use or recover from the substance(s), Persistent desire or unsuccessful efforts to cut down or control use, Social, occupational, recreational activities given up or reduced due to use, Repeated use in physically hazardous situations, Substance(s) often taken in larger amounts or over longer times than was intended, Evidence of withdrawal (Comment)  Recommendations for Services/Supports/Treatments: Recommendations for Services/Supports/Treatments Recommendations For Services/Supports/Treatments: CD-IOP Intensive Chemical Dependency Program  DSM5 Diagnoses: Patient Active Problem List   Diagnosis Date Noted   Tubular adenoma of colon 07/23/2020   Memory difficulties 01/10/2019   Gilbert's disease 05/17/2018   Insomnia 05/15/2018   Neuralgia neuritis, sciatic nerve 06/29/2016   Dupuytren's contracture 05/31/2016   Snoring 05/26/2016   Gout 09/22/2015   Psoriasis 09/22/2015   Hypertension 07/14/2015   Hyperlipidemia 07/14/2015   Alcohol use disorder, severe, dependence ([place]) 07/14/2015   Allergic rhinitis 07/14/2015   Benign non-nodular prostatic hyperplasia without lower urinary tract symptoms 11/26/2014    Patient Centered Plan: Patient is on the following Treatment Plan(s):  Substance Abuse Client meets criteria for alcohol use disorder severe and is recommended for CDIOP. Client is in agreement with goal to achieve/maintain sobriety 7/7 days weekly.   Referrals to Alternative Service(s): Referred to Alternative Service(s):   Place:   Date:   Time:    Referred to Alternative Service(s):   Place:   Date:   Time:    Referred to Alternative Service(s):   Place:   Date:   Time:    Referred to Alternative Service(s):   Place:   Date:   Time:     Olegario Messier, LCSW

## 2021-09-29 NOTE — Patient Instructions (Addendum)
Visit Information   Goals Addressed             This Visit's Progress    Prevent a Relapse-Substance Misuse       Timeframe:  Long-Range Goal Priority:  High Start Date:   09/21/21                          Expected End Date:  09/29/21                   Follow Up Date 09/29/21   - avoid negative self-talk - be honest with myself - begin personal counseling - identify triggers for relapse - join AA (Alcoholics Anonymous) or NA (Narcotics Anonymous) - make a plan to deal with triggers like holidays and anniversaries  -begin CDIOP program on 09/30/21   Why is this important?   It is possible that after you stop drinking or using drugs that you may slip and use again. This is called a relapse.  A relapse does not mean that you have failed. It means you have had one or a few bad days.  Make sure you know why it is important for you to stay sober and remind yourself often.  Once you figure out what triggered the relapse and take the steps to deal with it, you can be even more hopeful that recovery is possible.     Notes:         The patient verbalized understanding of instructions, educational materials, and care plan provided today and declined offer to receive copy of patient instructions, educational materials, and care plan.   No further follow up required: patient's spouse verbalized having no additional community resource needs  [employer], [place] Care Management [phone]

## 2021-09-29 NOTE — Chronic Care Management (AMB) (Cosign Needed)
Chronic Care Management    Clinical Social Work Note  09/29/2021 Name: Jeffrey Yates MRN: [id number] DOB: [birth year]  Jeffrey Yates is a 70 y.o. year old male who is a primary care patient of Hubbard Hartshorn, FNP. The CCM team was consulted to assist the patient with chronic disease management and/or care coordination needs related to: [name] .   Collaboration with patient's spouse  for  follow up regarding CDIOP program  in response to provider referral for social work chronic care management and care coordination services.   Consent to Services:  The patient was given information about Chronic Care Management services, agreed to services, and gave verbal consent prior to initiation of services.  Please see initial visit note for detailed documentation.   Patient agreed to services and consent obtained.   Assessment: Review of patient past medical history, allergies, medications, and health status, including review of relevant consultants reports was performed today as part of a comprehensive evaluation and provision of chronic care management and care coordination services.     SDOH (Social Determinants of Health) assessments and interventions performed:    Advanced Directives Status: Not addressed in this encounter.  CCM Care Plan  Allergies  Allergen Reactions   Aricept [Donepezil Hcl] Rash   Namenda [Memantine Hcl] Rash    Outpatient Encounter Medications as of 09/29/2021  Medication Sig   ascorbic acid (VITAMIN C) 1000 MG tablet Take 1,000 mg by mouth daily.   Cholecalciferol (VITAMIN D3) 1000 UNITS CAPS Take 1 capsule by mouth.   dutasteride (AVODART) 0.5 MG capsule Take 0.5 mg by mouth daily.   metoprolol succinate (TOPROL-XL) 100 MG 24 hr tablet Take 1 tablet (100 mg total) by mouth daily.   mometasone (ELOCON) 0.1 % cream Apply 1 application topically daily. As needed for flares, use sparingly   tamsulosin (FLOMAX) 0.4 MG CAPS capsule Take 0.4 mg by mouth  daily.   thiamine 100 MG tablet Take 1 tablet (100 mg total) by mouth daily.   traZODone (DESYREL) 50 MG tablet TAKE 1 TABLET(50 MG) BY MOUTH AT BEDTIME AS NEEDED FOR SLEEP   vitamin B-12 (CYANOCOBALAMIN) 1000 MCG tablet Take 1,000 mcg by mouth daily.   No facility-administered encounter medications on file as of 09/29/2021.    Patient Active Problem List   Diagnosis Date Noted   Tubular adenoma of colon 07/23/2020   Memory difficulties 01/10/2019   Gilbert's disease 05/17/2018   Insomnia 05/15/2018   Neuralgia neuritis, sciatic nerve 06/29/2016   Dupuytren's contracture 05/31/2016   Snoring 05/26/2016   Gout 09/22/2015   Psoriasis 09/22/2015   Hypertension 07/14/2015   Hyperlipidemia 07/14/2015   Alcohol use disorder, severe, dependence ([place]) 07/14/2015   Allergic rhinitis 07/14/2015   Benign non-nodular prostatic hyperplasia without lower urinary tract symptoms 11/26/2014    Conditions to be addressed/monitored: Substance abuse issues -  substance misuse  Care Plan : Substance Misuse (Adult)  Updates made by Vern Claude, LCSW since 09/29/2021 12:00 AM     Problem: Relapse Risk      Goal: Relapse Risk Minimized   Start Date: 09/21/2021  Expected End Date: 09/29/2021  This Visit's Progress: On track  Recent Progress: Not on track  Priority: High  Note:   Current Barriers:  Substance misuse Substance abuse issues -  alcohol misuse Suicidal Ideation/Homicidal Ideation: No  Clinical Social Work Goal(s):  Over the next 90 days, patient will work with SW bi-weekly by telephone or in person to maintain abstinence from alcohol  use  Interventions: SDOH Interventions    Flowsheet Row Most Recent Value  SDOH Interventions   SDOH Interventions for the Following Domains Alcohol Usage  Alcohol Brief Interventions/Follow-up Alcohol education/Brief advice  [will refer for outpatient substance abuse counseling]     Patient and spouse confirmed that patient has relapsed  and did not follow through with the CDIOP referral Confirmed that patient's spouse made contact with the program, however patient did not make himself available for the required screening before he could start the program CDIOP program willing to work with patient if patient is agreeable, spouse to contact patient today to discuss plan to re-schedule assessment Emotional support provided to spouse Active listening / Reflection utilized  Problem [place] strategies reviewed   Plan to follow up with patient next week 09/29/21 Outreach to patient's spouse, confirmed that patient will be starting the [place] program on 09/30/21. Patient spouse confirmed no additional community resource needs at this time.   Patient Self Care Activities:  Motivation for treatment Strong family or social support  Patient Coping Strengths:  Family Able to Communicate Effectively  Patient Self Care Deficits:  Knowledge deficit of in-network outpatient recovery programs  Please see past updates related to this goal by clicking on the "Past Updates" button in the selected goal         Follow Up Plan:  Patient's spouse verbalized no additional community resource needs      Elliot Gurney, [place] Worker  [facility] Care Management [phone]

## 2021-09-29 NOTE — Chronic Care Management (AMB) (Deleted)
Chronic Care Management    Clinical Social Work Note  09/29/2021 Name: Jeffrey Yates MRN: [id number] DOB: [birth year]  Jeffrey Yates is a 70 y.o. year old male who is a primary care patient of Jeffrey Hartshorn, FNP. The CCM team was consulted to assist the patient with chronic disease management and/or care coordination needs related to: [name] .   Engaged with patient face to face for follow up visit in response to provider referral for social work chronic care management and care coordination services.   Consent to Services:  The patient was given information about Chronic Care Management services, agreed to services, and gave verbal consent prior to initiation of services.  Please see initial visit note for detailed documentation.   Patient agreed to services and consent obtained.   Assessment: Review of patient past medical history, allergies, medications, and health status, including review of relevant consultants reports was performed today as part of a comprehensive evaluation and provision of chronic care management and care coordination services.     SDOH (Social Determinants of Health) assessments and interventions performed:    Advanced Directives Status: Not addressed in this encounter.  CCM Care Plan  Allergies  Allergen Reactions   Aricept [Donepezil Hcl] Rash   Namenda [Memantine Hcl] Rash    Outpatient Encounter Medications as of 09/29/2021  Medication Sig   ascorbic acid (VITAMIN C) 1000 MG tablet Take 1,000 mg by mouth daily.   Cholecalciferol (VITAMIN D3) 1000 UNITS CAPS Take 1 capsule by mouth.   dutasteride (AVODART) 0.5 MG capsule Take 0.5 mg by mouth daily.   metoprolol succinate (TOPROL-XL) 100 MG 24 hr tablet Take 1 tablet (100 mg total) by mouth daily.   mometasone (ELOCON) 0.1 % cream Apply 1 application topically daily. As needed for flares, use sparingly   tamsulosin (FLOMAX) 0.4 MG CAPS capsule Take 0.4 mg by mouth daily.   thiamine 100 MG  tablet Take 1 tablet (100 mg total) by mouth daily.   traZODone (DESYREL) 50 MG tablet TAKE 1 TABLET(50 MG) BY MOUTH AT BEDTIME AS NEEDED FOR SLEEP   vitamin B-12 (CYANOCOBALAMIN) 1000 MCG tablet Take 1,000 mcg by mouth daily.   No facility-administered encounter medications on file as of 09/29/2021.    Patient Active Problem List   Diagnosis Date Noted   Tubular adenoma of colon 07/23/2020   Memory difficulties 01/10/2019   Gilbert's disease 05/17/2018   Insomnia 05/15/2018   Neuralgia neuritis, sciatic nerve 06/29/2016   Dupuytren's contracture 05/31/2016   Snoring 05/26/2016   Gout 09/22/2015   Psoriasis 09/22/2015   Hypertension 07/14/2015   Hyperlipidemia 07/14/2015   Alcohol use disorder, severe, dependence ([place]) 07/14/2015   Allergic rhinitis 07/14/2015   Benign non-nodular prostatic hyperplasia without lower urinary tract symptoms 11/26/2014    Conditions to be addressed/monitored: Substance abuse issues -  Alcohol Misuse  Care Plan : Substance Misuse (Adult)  Updates made by Vern Claude, LCSW since 09/29/2021 12:00 AM     Problem: Relapse Risk      Goal: Relapse Risk Minimized   Start Date: 09/21/2021  Expected End Date: 09/29/2021  This Visit's Progress: On track  Recent Progress: Not on track  Priority: High  Note:   Current Barriers:  Substance misuse Substance abuse issues -  alcohol misuse Suicidal Ideation/Homicidal Ideation: No  Clinical Social Work Goal(s):  Over the next 90 days, patient will work with SW bi-weekly by telephone or in person to maintain abstinence from alcohol use  Interventions:  SDOH Interventions    Flowsheet Row Most Recent Value  SDOH Interventions   SDOH Interventions for the Following Domains Alcohol Usage  Alcohol Brief Interventions/Follow-up Alcohol education/Brief advice  [will refer for outpatient substance abuse counseling]     Patient and spouse confirmed that patient has relapsed and did not follow through  with the CDIOP referral Confirmed that patient's spouse made contact with the program, however patient did not make himself available for the required screening before he could start the program CDIOP program willing to work with patient if patient is agreeable, spouse to contact patient today to discuss plan to re-schedule assessment Emotional support provided to spouse Active listening / Reflection utilized  Problem [place] strategies reviewed   Plan to follow up with patient next week 09/29/21 Outreach to patient's spouse, confirmed that patient will be starting the [place] program on 09/30/21. Patient spouse confirmed no additional community resource needs at this time.   Patient Self Care Activities:  Motivation for treatment Strong family or social support  Patient Coping Strengths:  Family Able to Communicate Effectively  Patient Self Care Deficits:  Knowledge deficit of in-network outpatient recovery programs  Please see past updates related to this goal by clicking on the "Past Updates" button in the selected goal         Follow Up Plan:  No further follow up needed-patient/patient's spouse to contact this Education officer, museum with any additional community resource needs      [place], [place] Worker  [facility] Care Management [phone]

## 2021-09-30 ENCOUNTER — Other Ambulatory Visit (HOSPITAL_COMMUNITY): Payer: Medicare HMO | Attending: Psychiatry | Admitting: Licensed Clinical Social Worker

## 2021-09-30 DIAGNOSIS — F1020 Alcohol dependence, uncomplicated: Secondary | ICD-10-CM | POA: Diagnosis not present

## 2021-09-30 DIAGNOSIS — R69 Illness, unspecified: Secondary | ICD-10-CM | POA: Diagnosis not present

## 2021-10-01 ENCOUNTER — Telehealth (HOSPITAL_COMMUNITY): Payer: Self-pay | Admitting: Licensed Clinical Social Worker

## 2021-10-01 ENCOUNTER — Telehealth: Payer: Medicare HMO

## 2021-10-01 ENCOUNTER — Encounter (HOSPITAL_COMMUNITY): Payer: Self-pay

## 2021-10-05 ENCOUNTER — Encounter (HOSPITAL_COMMUNITY): Payer: Self-pay

## 2021-10-05 DIAGNOSIS — R413 Other amnesia: Secondary | ICD-10-CM | POA: Diagnosis not present

## 2021-10-05 NOTE — Progress Notes (Signed)
    Daily Group Progress Note  Program: CD-IOP   Group Time: 1-230  Participation Level: Minimal  Behavioral Response: Resistant and Minimizing  Type of Therapy: Process Group  Topic: Clinician checked in with group members, assessed for SI/HI/psychosis and overall level of functioning. Clinician inquired about sobriety date and engagement with community support groups. Clinician and client processed recent successes and progress toward recovery and challenges to sobriety. Clinician and group members processed upcoming high risk events. Clinician and group members processed stuck points based on distorted thinking related to cravings. Clinician and group members read AA Daily reflection and NA Just for today win relation to current space in recovery.     Group Time: 230-4  Participation Level: None  Behavioral Response:  N/A did not attend  Type of Therapy: Psycho-education Group  Topic: Clinician provided mindfulness activity for practice in session. Clinician presented 'Four Most Common Reasons for Relapse in Early Recovery' and discussed progression from trigger to thought to craving to use. Clinician and group members reviewed DBT Skill Wise Mind/ Clear Mind. Clinician and group members reviewed Addict Mind thoughts vs Clean Mind thoughts and behaviors with a focus on identifying how to stay in [place]. Clients identified possible irrelevant behaviors to be mindful of which could lead to relapse.   Summary: Client presented late for group stating he got lost. Client listened to group members check in then requested to leave. Clinician spoke with clients about concerns and encouraged client to share conversation in group setting. Client shared however was not receptive to feedback from clinician and group stating drinking was a problem to cope with on his own, thanked the group, and left prior to end of first group.   Family Program: Family present? No   Name of family  member(s): NA wife contacted after group to discuss outcome  UDS collected: No Results: NA  AA/NA attended?: No  Sponsor?: No   Olegario Messier, LCSW

## 2021-10-07 ENCOUNTER — Encounter (HOSPITAL_COMMUNITY): Payer: Self-pay

## 2021-10-08 ENCOUNTER — Encounter (HOSPITAL_COMMUNITY): Payer: Self-pay

## 2021-10-12 ENCOUNTER — Encounter (HOSPITAL_COMMUNITY): Payer: Self-pay

## 2021-10-12 DIAGNOSIS — I10 Essential (primary) hypertension: Secondary | ICD-10-CM | POA: Diagnosis not present

## 2021-10-14 ENCOUNTER — Encounter (HOSPITAL_COMMUNITY): Payer: Self-pay

## 2021-10-15 ENCOUNTER — Encounter (HOSPITAL_COMMUNITY): Payer: Self-pay

## 2021-10-19 ENCOUNTER — Encounter (HOSPITAL_COMMUNITY): Payer: Self-pay

## 2021-10-21 ENCOUNTER — Encounter (HOSPITAL_COMMUNITY): Payer: Self-pay

## 2021-10-22 ENCOUNTER — Encounter (HOSPITAL_COMMUNITY): Payer: Self-pay

## 2021-10-26 ENCOUNTER — Encounter (HOSPITAL_COMMUNITY): Payer: Self-pay

## 2021-10-28 ENCOUNTER — Encounter (HOSPITAL_COMMUNITY): Payer: Self-pay

## 2021-10-29 ENCOUNTER — Encounter (HOSPITAL_COMMUNITY): Payer: Self-pay

## 2021-11-02 ENCOUNTER — Encounter (HOSPITAL_COMMUNITY): Payer: Self-pay

## 2021-11-04 ENCOUNTER — Encounter (HOSPITAL_COMMUNITY): Payer: Self-pay

## 2021-11-09 ENCOUNTER — Encounter (HOSPITAL_COMMUNITY): Payer: Self-pay

## 2021-11-11 ENCOUNTER — Encounter (HOSPITAL_COMMUNITY): Payer: Self-pay

## 2021-12-08 ENCOUNTER — Other Ambulatory Visit: Payer: Self-pay | Admitting: Family Medicine

## 2021-12-08 NOTE — Telephone Encounter (Signed)
Copied from [place] [id number]. Topic: Quick Communication - Rx Refill/Question >> Dec 08, 2021 10:15 AM Yvette Rack wrote: Medication: metoprolol succinate (TOPROL-XL) 100 MG 24 hr tablet  Has the patient contacted their pharmacy? Yes.  Pt told to contact provider (Agent: If no, request that the patient contact the pharmacy for the refill. If patient does not wish to contact the pharmacy document the reason why and proceed with request.) (Agent: If yes, when and what did the pharmacy advise?)  Preferred Pharmacy (with phone number or street name): [id number] - [place], [address]  Phone:  [phone] Fax:  [phone]  Has the patient been seen for an appointment in the last year OR does the patient have an upcoming appointment? Yes.    Agent: Please be advised that RX refills may take up to 3 business days. We ask that you follow-up with your pharmacy.

## 2021-12-09 MED ORDER — METOPROLOL SUCCINATE ER 100 MG PO TB24: 100.0000 mg | ORAL_TABLET | Freq: Every day | ORAL | 0 refills | Status: DC

## 2021-12-09 NOTE — Telephone Encounter (Signed)
Requested medications are due for refill today.Unsure.  Requested medications are on the active medications list.  yes  Last refill. 07/24/2020  Future visit scheduled.   no  Notes to clinic.  PCP listed as Raelyn Ensign. Last saw Dr. Ky Barban 09/15/2021    Requested Prescriptions  Pending Prescriptions Disp Refills   metoprolol succinate (TOPROL-XL) 100 MG 24 hr tablet 90 tablet 1    Sig: Take 1 tablet (100 mg total) by mouth daily.     Cardiovascular:  Beta Blockers Passed - 12/08/2021 11:39 AM      Passed - Last BP in normal range    BP Readings from Last 1 Encounters:  09/15/21 116/78          Passed - Last Heart Rate in normal range    Pulse Readings from Last 1 Encounters:  09/15/21 91          Passed - Valid encounter within last 6 months    Recent Outpatient Visits           2 months ago AA (alcohol abuse)   [place], DO   1 year ago Essential hypertension   [facility] Tow[place] Malkin, MD   2 years ago Annual physical exam   [place], FNP   2 years ago [place], NP   2 years ago Dupuytren contracture   [place], NP

## 2021-12-09 NOTE — Telephone Encounter (Signed)
1 month given of bp med, needs f/u appt

## 2021-12-09 NOTE — Telephone Encounter (Signed)
Lvm for pt to call and schedule an appt  

## 2021-12-12 IMAGING — CR DG PELVIS 1-2V
1 series · 1 of 1 positions shown · non-contrast
Comparison: None.

CLINICAL DATA: Pelvic pain after a fall last night. Initial
encounter.

EXAM:
PELVIS - 1-2 VIEW

[dg pelvis 1-2 views]
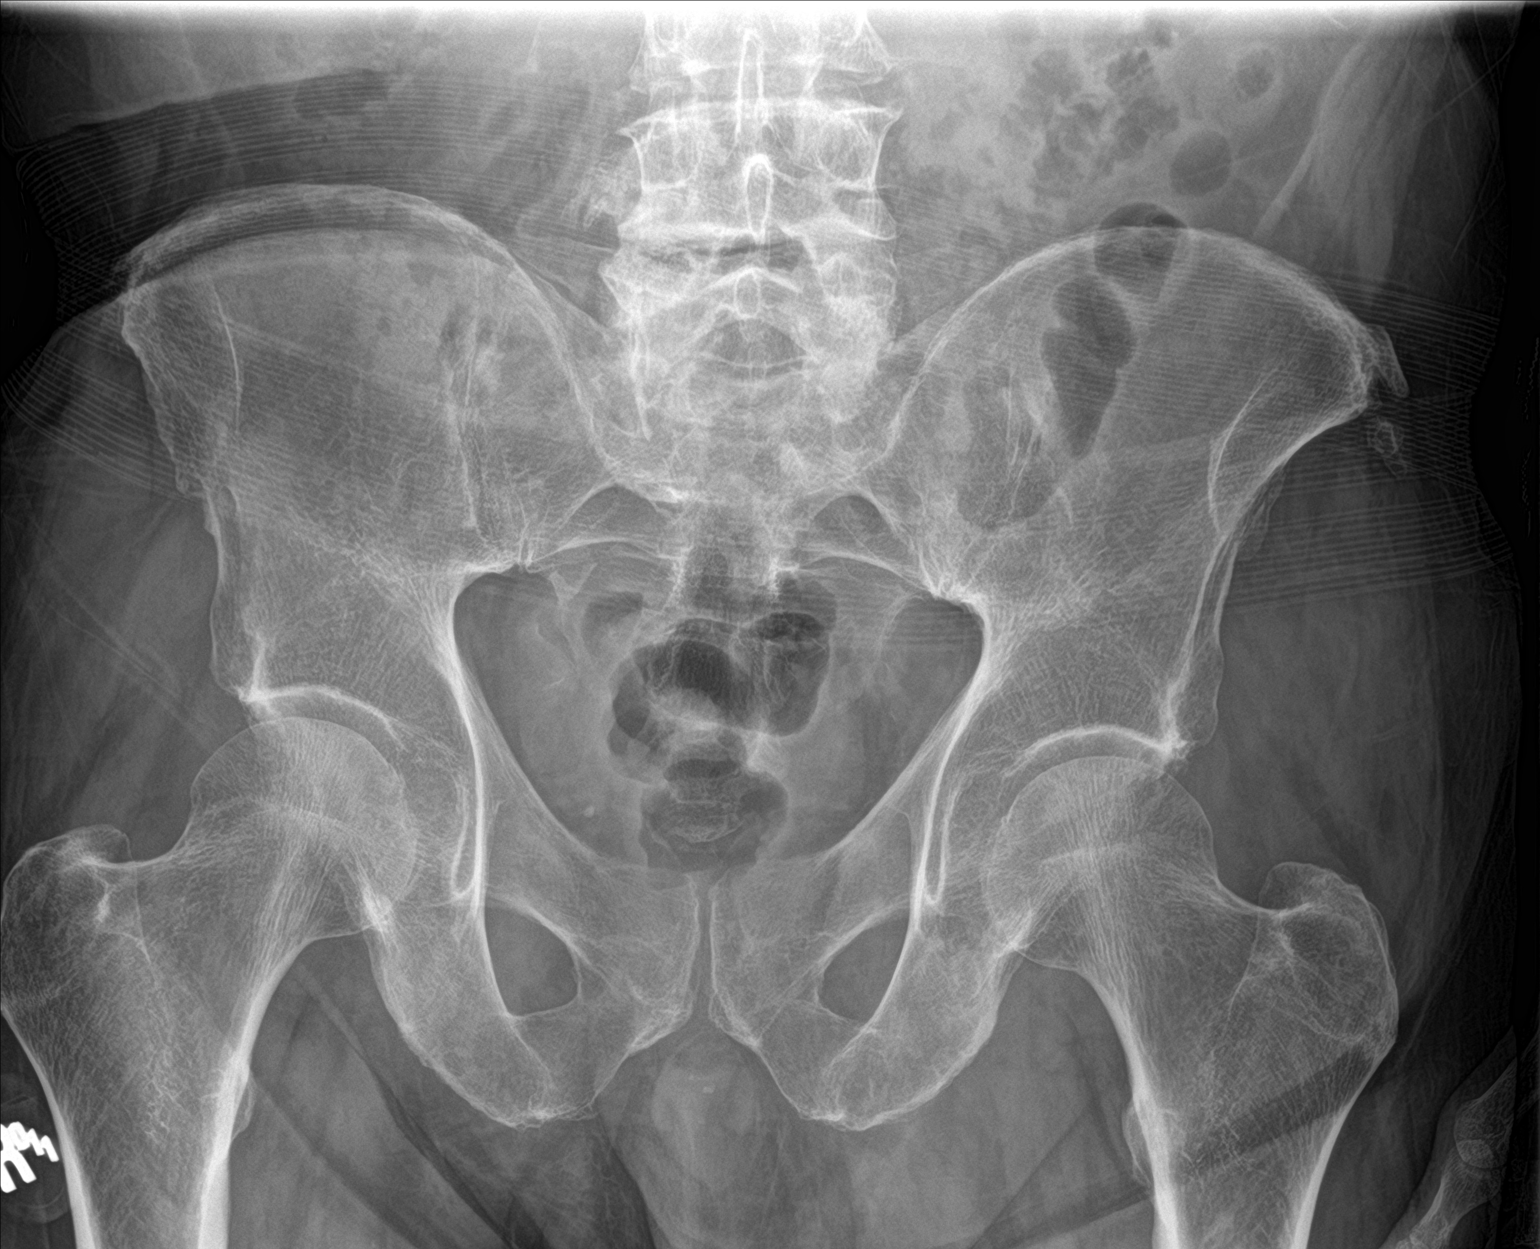

[1 of 1 positions shown; findings below may reference images not displayed]

FINDINGS: There is no evidence of pelvic fracture or diastasis. No pelvic bone
lesions are seen.
IMPRESSION: Negative exam.

## 2021-12-12 IMAGING — CT CT HEAD W/O CM
3 series · 16 of 47 positions shown, 19 images · non-contrast
Comparison: 08/10/2010

CLINICAL DATA: Fall, facial trauma

EXAM:
CT HEAD WITHOUT CONTRAST
TECHNIQUE: Contiguous axial images were obtained from the base of the skull
through the vertex without intravenous contrast.

[Series 2: head wo · axial · 0.46mm/px · z∈[-84,+41]mm · 10 of 31 slices shown, 13 images]
[im 3/31  brain]
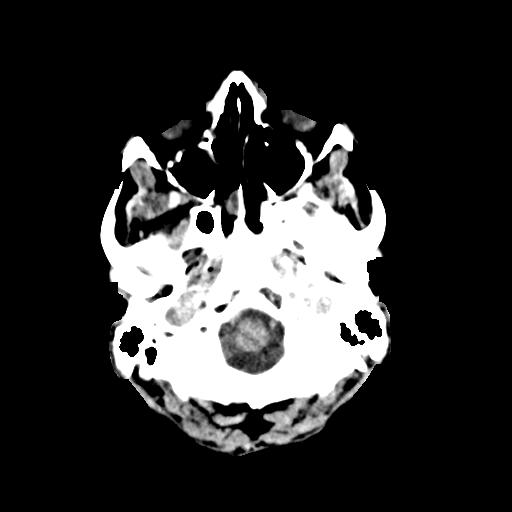
[im 3/31  bone]
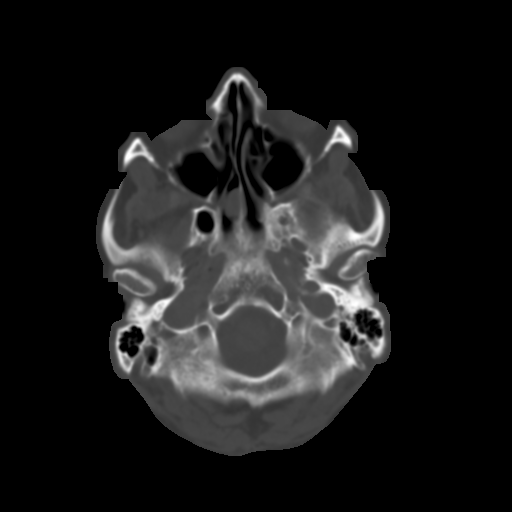
[im 6/31  brain]
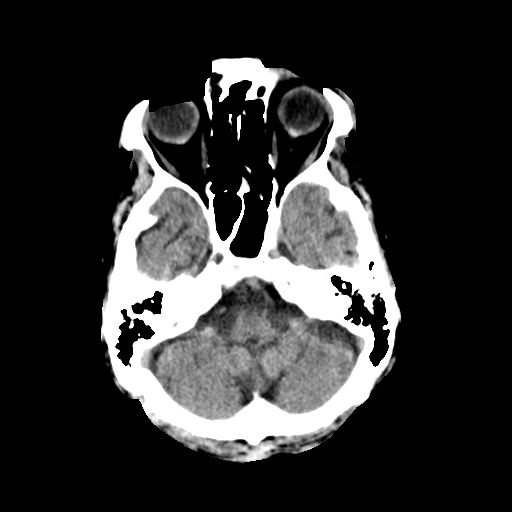
[im 9/31  brain]
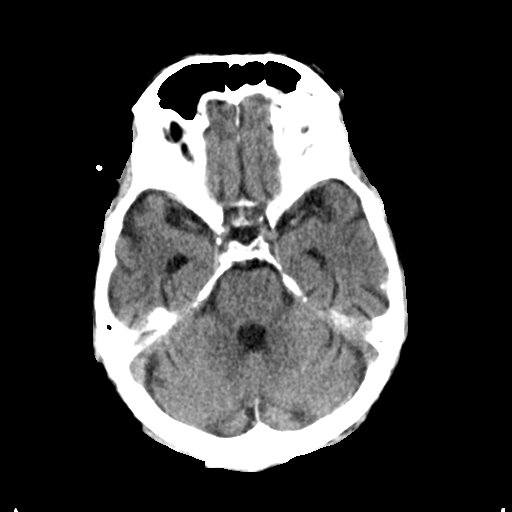
[im 11/31  brain]
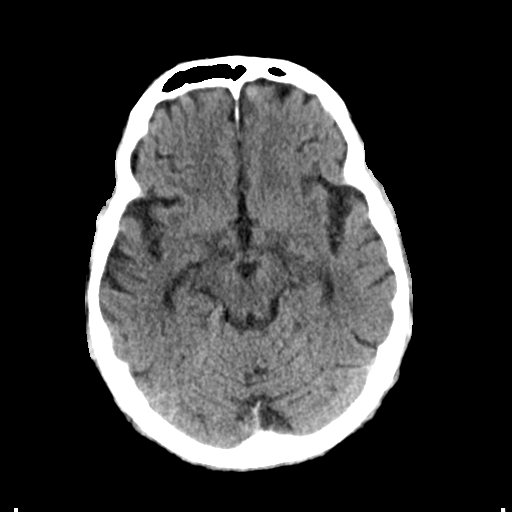
[im 14/31  brain]
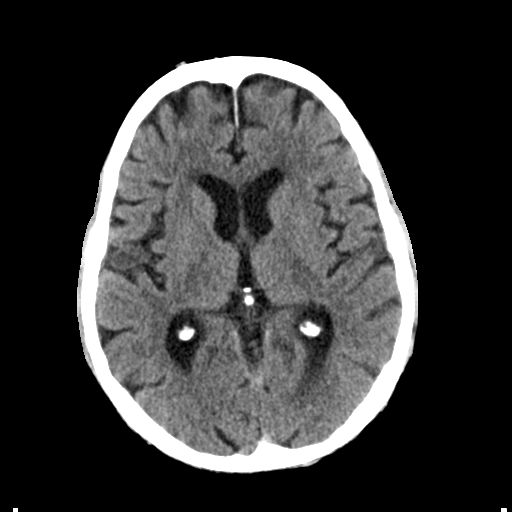
[im 14/31  bone]
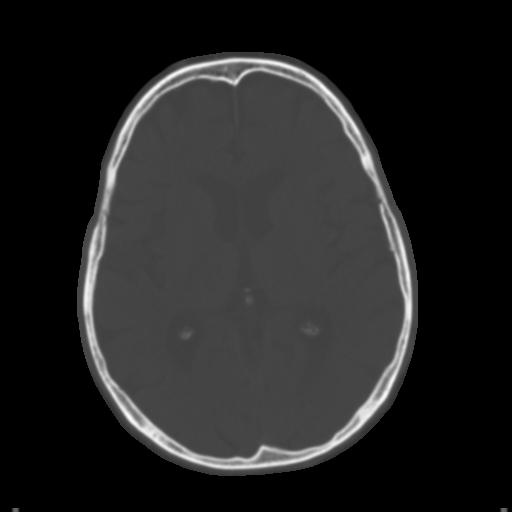
[im 17/31  brain]
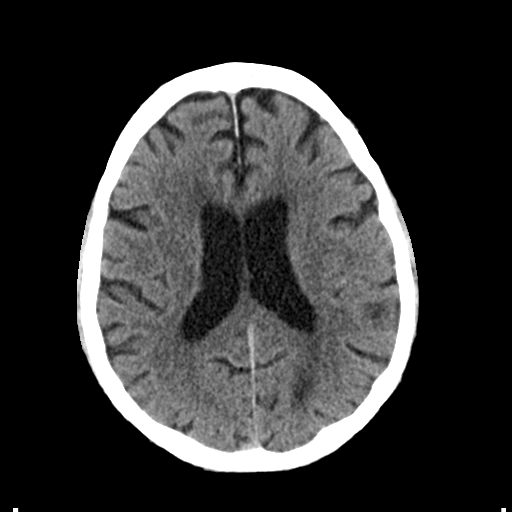
[im 20/31  brain]
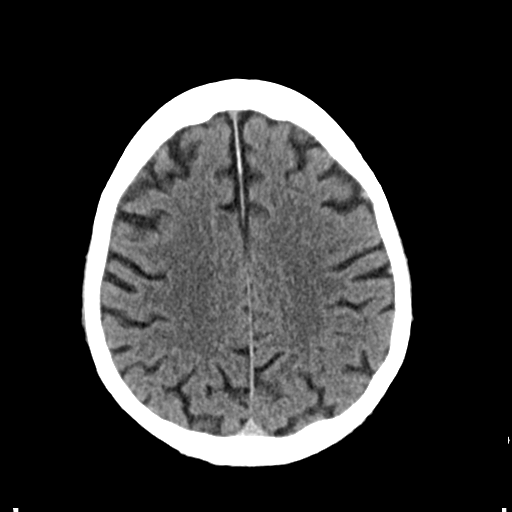
[im 23/31  brain]
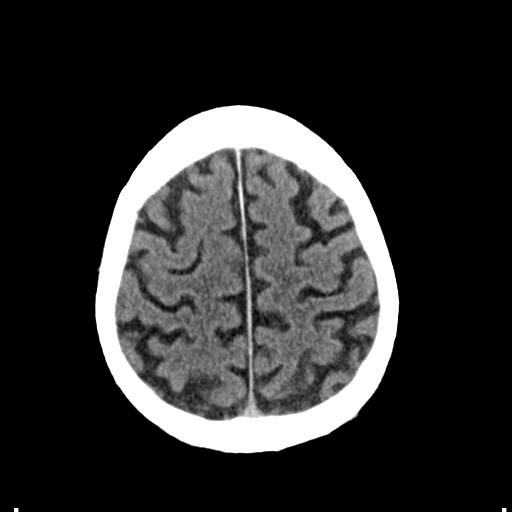
[im 25/31  brain]
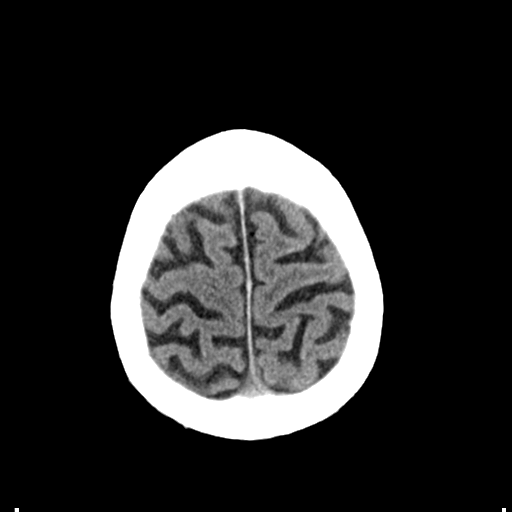
[im 25/31  bone]
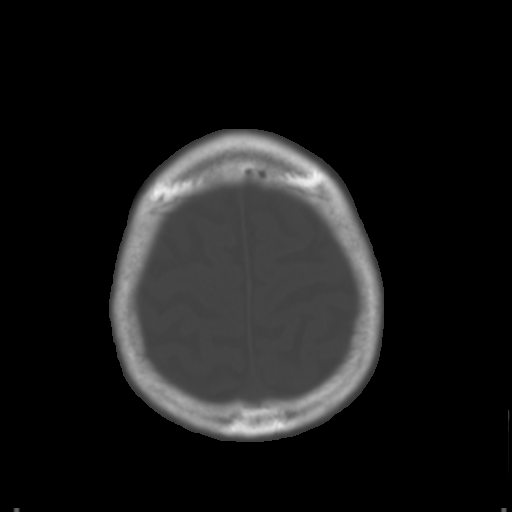
[im 28/31  brain]
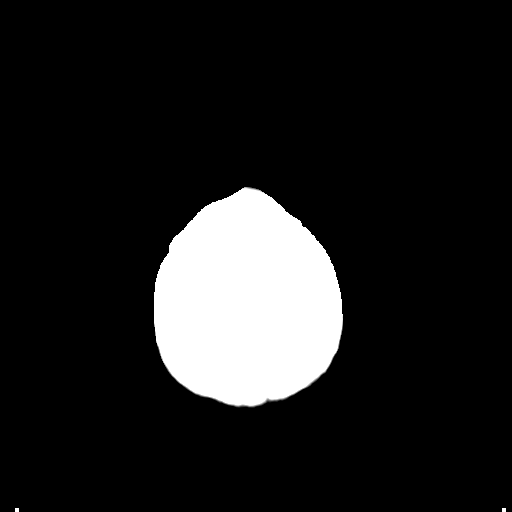

[Series 4: coronal soft tissue · coronal · 0.32mm/px · 3 of 66 slices shown]
[im 22/66  brain]
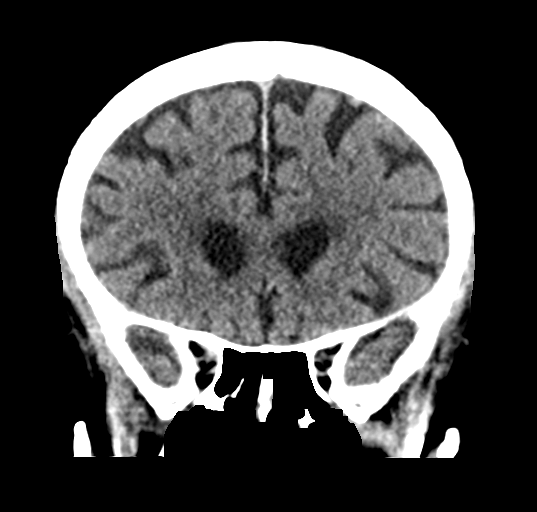
[im 29/66  brain]
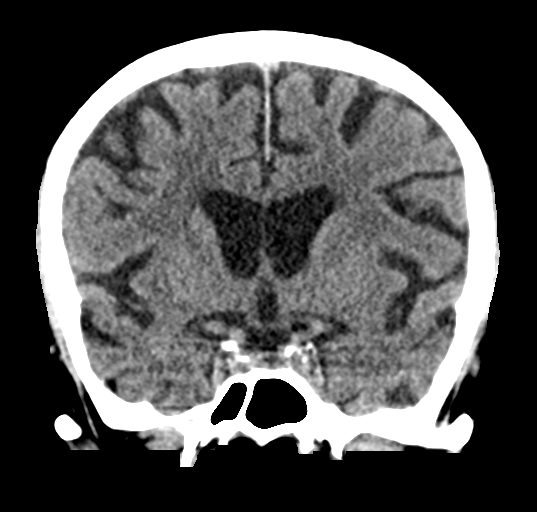
[im 37/66  brain]
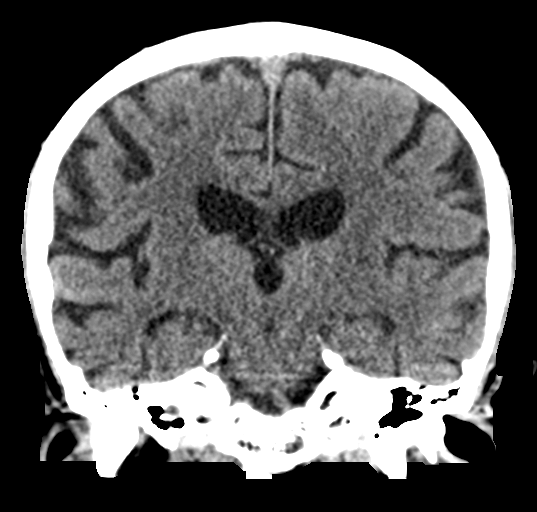

[Series 5: sagittal soft tissue · sagittal · 0.32mm/px · 3 of 54 slices shown]
[im 18/54  brain]
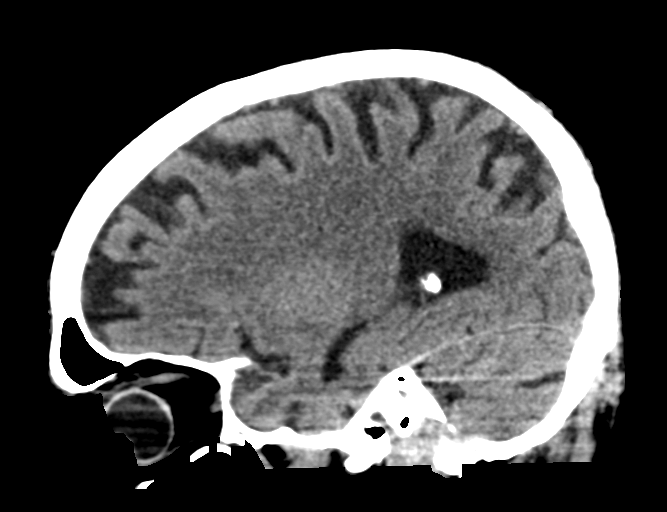
[im 27/54  brain]
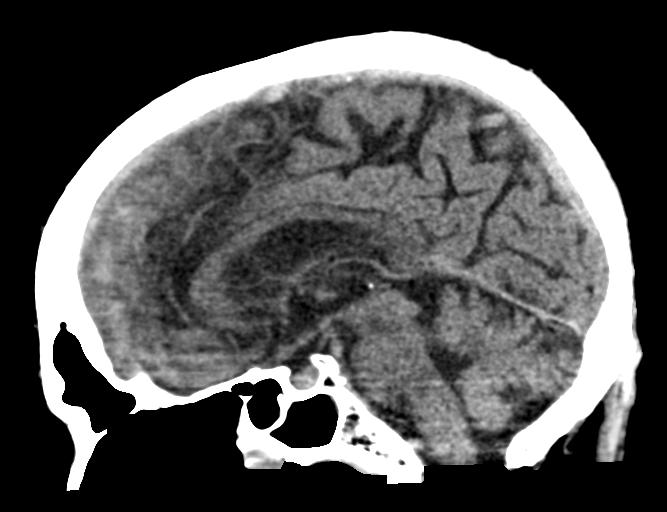
[im 36/54  brain]
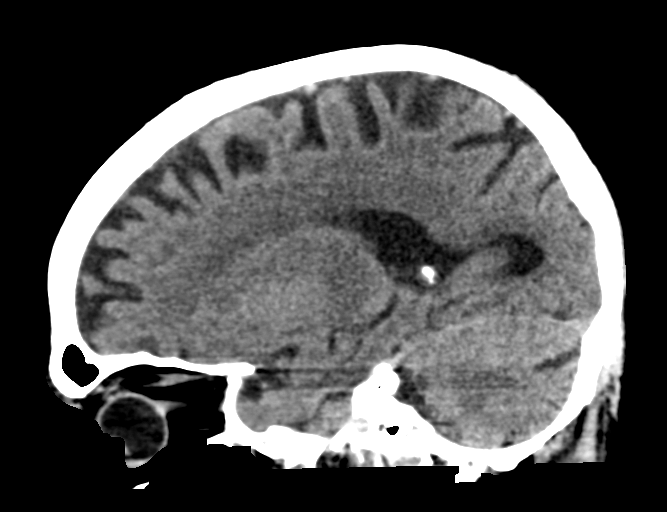

[16 of 47 positions shown; findings below may reference images not displayed]

FINDINGS: Brain: No acute intracranial abnormality. Specifically, no
hemorrhage, hydrocephalus, mass lesion, acute infarction, or
significant intracranial injury.

Vascular: No hyperdense vessel or unexpected calcification.

Skull: No acute calvarial abnormality.

Sinuses/Orbits: Visualized paranasal sinuses and mastoids clear.
Orbital soft tissues unremarkable.

Other: None
IMPRESSION: No acute intracranial abnormality.

## 2021-12-16 ENCOUNTER — Other Ambulatory Visit: Payer: Self-pay

## 2021-12-16 ENCOUNTER — Encounter: Payer: Self-pay | Admitting: Nurse Practitioner

## 2021-12-16 ENCOUNTER — Ambulatory Visit (INDEPENDENT_AMBULATORY_CARE_PROVIDER_SITE_OTHER): Payer: Medicare HMO | Admitting: Nurse Practitioner

## 2021-12-16 VITALS — BP 122/76 | HR 83 | Temp 98.3°F | Resp 16 | Ht 71.0 in | Wt 186.5 lb

## 2021-12-16 DIAGNOSIS — R413 Other amnesia: Secondary | ICD-10-CM

## 2021-12-16 DIAGNOSIS — Z23 Encounter for immunization: Secondary | ICD-10-CM | POA: Diagnosis not present

## 2021-12-16 DIAGNOSIS — Z5181 Encounter for therapeutic drug level monitoring: Secondary | ICD-10-CM | POA: Diagnosis not present

## 2021-12-16 DIAGNOSIS — E782 Mixed hyperlipidemia: Secondary | ICD-10-CM

## 2021-12-16 DIAGNOSIS — I10 Essential (primary) hypertension: Secondary | ICD-10-CM

## 2021-12-16 MED ORDER — METOPROLOL SUCCINATE ER 100 MG PO TB24
100.0000 mg | ORAL_TABLET | Freq: Every day | ORAL | 1 refills | Status: DC
Start: 2021-12-16 — End: 2022-07-02

## 2021-12-16 NOTE — Progress Notes (Signed)
BP 122/76    Pulse 83    Temp 98.3 F (36.8 C) (Oral)    Resp 16    Ht 5\' 11"  (1.803 m)    Wt 186 lb 8 oz (84.6 kg)    SpO2 96%    BMI 26.01 kg/m    Subjective:    Patient ID: Jeffrey Yates, male    DOB: 09-24-1951, 71 y.o.   MRN: [id number]  HPI: Jeffrey Yates is a 71 y.o. male, here with wife  Chief Complaint  Patient presents with   Medication Refill   HTN: He does not check his blood pressure at home but denies any chest pain, shortness of breath, headaches or blurred vision.  Blood pressure in the office was 122/76.  He is currently taking metoprolol 100 mg daily.    Hyperlipidemia: Last LDL was 54, on 11/23/2019.  Not currently on any treatment.  Eats a well balanced diet.  Will get labs today.  The 10-year ASCVD risk score (Arnett DK, et al., 2019) is: 17%   Values used to calculate the score:     Age: 63 years     Sex: Male     Is Non-Hispanic African American: No     Diabetic: No     Tobacco smoker: Yes     Systolic Blood Pressure: 350 mmHg     Is BP treated: Yes     HDL Cholesterol: 69 mg/dL     Total Cholesterol: 137 mg/dL   Loss of memory:  His memory has gotten worse and more frequent per wife.  His last MMSE was 10/05/21 and it was 19/30. Sees [name] (Neurology) last seen on 10/05/21.  Was started on Aricept 5 mg nightly and Namenda 5 mg in the morning.  Recommended to have TSH and B12 checked.  Labs ordered.   Relevant past medical, surgical, family and social history reviewed and updated as indicated. Interim medical history since our last visit reviewed. Allergies and medications reviewed and updated.  Review of Systems  Constitutional: Negative for fever or weight change.  Respiratory: Negative for cough and shortness of breath.   Cardiovascular: Negative for chest pain or palpitations.  Gastrointestinal: Negative for abdominal pain, no bowel changes.  Musculoskeletal: Negative for gait problem or joint swelling.  Skin: Negative for rash.   Neurological: Negative for dizziness or headache. Positive for memory problems No other specific complaints in a complete review of systems (except as listed in HPI above).      Objective:    BP 122/76    Pulse 83    Temp 98.3 F (36.8 C) (Oral)    Resp 16    Ht 5\' 11"  (1.803 m)    Wt 186 lb 8 oz (84.6 kg)    SpO2 96%    BMI 26.01 kg/m   Wt Readings from Last 3 Encounters:  12/16/21 186 lb 8 oz (84.6 kg)  09/15/21 178 lb 8 oz (81 kg)  01/28/21 180 lb (81.6 kg)    Physical Exam  Constitutional: Patient appears well-developed and well-nourished. No distress.  HEENT: head atraumatic, normocephalic, pupils equal and reactive to light, neck supple Cardiovascular: Normal rate, regular rhythm and normal heart sounds.  No murmur heard. No BLE edema. Pulmonary/Chest: Effort normal and breath sounds normal. No respiratory distress. Abdominal: Soft.  There is no tenderness. Psychiatric: Patient has a normal mood and affect. behavior is normal. Judgment and thought content normal.   Results for orders placed or performed during  the hospital encounter of 09/14/21  Comprehensive metabolic panel  Result Value Ref Range   Sodium 137 135 - 145 mmol/L   Potassium 3.7 3.5 - 5.1 mmol/L   Chloride 102 98 - 111 mmol/L   CO2 26 22 - 32 mmol/L   Glucose, Bld 104 (H) 70 - 99 mg/dL   BUN 15 8 - 23 mg/dL   Creatinine, Ser 0.77 0.61 - 1.24 mg/dL   Calcium 9.2 8.9 - 10.3 mg/dL   Total Protein 6.7 6.5 - 8.1 g/dL   Albumin 4.2 3.5 - 5.0 g/dL   AST 26 15 - 41 U/L   ALT 14 0 - 44 U/L   Alkaline Phosphatase 110 38 - 126 U/L   Total Bilirubin 1.6 (H) 0.3 - 1.2 mg/dL   GFR, Estimated >60 >60 mL/min   Anion gap 9 5 - 15  Ethanol  Result Value Ref Range   Alcohol, Ethyl (B) <10 <10 mg/dL  Urine rapid drug screen (hosp performed)  Result Value Ref Range   Opiates NONE DETECTED NONE DETECTED   Cocaine NONE DETECTED NONE DETECTED   Benzodiazepines NONE DETECTED NONE DETECTED   Amphetamines NONE DETECTED  NONE DETECTED   Tetrahydrocannabinol NONE DETECTED NONE DETECTED   Barbiturates NONE DETECTED NONE DETECTED  CBC with Diff  Result Value Ref Range   WBC 5.5 4.0 - 10.5 K/uL   RBC 4.04 (L) 4.22 - 5.81 MIL/uL   Hemoglobin 13.2 13.0 - 17.0 g/dL   HCT 40.3 39.0 - 52.0 %   MCV 99.8 80.0 - 100.0 fL   MCH 32.7 26.0 - 34.0 pg   MCHC 32.8 30.0 - 36.0 g/dL   RDW 13.8 11.5 - 15.5 %   Platelets 191 150 - 400 K/uL   nRBC 0.0 0.0 - 0.2 %   Neutrophils Relative % 69 %   Neutro Abs 3.8 1.7 - 7.7 K/uL   Lymphocytes Relative 13 %   Lymphs Abs 0.7 0.7 - 4.0 K/uL   Monocytes Relative 15 %   Monocytes Absolute 0.8 0.1 - 1.0 K/uL   Eosinophils Relative 2 %   Eosinophils Absolute 0.1 0.0 - 0.5 K/uL   Basophils Relative 1 %   Basophils Absolute 0.0 0.0 - 0.1 K/uL   Immature Granulocytes 0 %   Abs Immature Granulocytes 0.02 0.00 - 0.07 K/uL  Acetaminophen level  Result Value Ref Range   Acetaminophen (Tylenol), Serum <10 (L) 10 - 30 ug/mL  Salicylate level  Result Value Ref Range   Salicylate Lvl <9.5 (L) 7.0 - 30.0 mg/dL  Urinalysis, Routine w reflex microscopic  Result Value Ref Range   Color, Urine YELLOW YELLOW   APPearance CLEAR CLEAR   Specific Gravity, Urine 1.006 1.005 - 1.030   pH 8.0 5.0 - 8.0   Glucose, UA NEGATIVE NEGATIVE mg/dL   Hgb urine dipstick SMALL (A) NEGATIVE   Bilirubin Urine NEGATIVE NEGATIVE   Ketones, ur NEGATIVE NEGATIVE mg/dL   Protein, ur NEGATIVE NEGATIVE mg/dL   Nitrite NEGATIVE NEGATIVE   Leukocytes,Ua NEGATIVE NEGATIVE   RBC / HPF 0-5 0 - 5 RBC/hpf   WBC, UA 0-5 0 - 5 WBC/hpf   Bacteria, UA NONE SEEN NONE SEEN      Assessment & Plan:   1. Essential hypertension -continue current treatment - CBC with Differential/Platelet - COMPLETE METABOLIC PANEL WITH GFR - metoprolol succinate (TOPROL-XL) 100 MG 24 hr tablet; Take 1 tablet (100 mg total) by mouth daily.  Dispense: 90 tablet; Refill: 1  2. Mixed  hyperlipidemia  - Lipid panel  3. Need for  influenza vaccination  - Flu Vaccine QUAD High Dose(Fluad)  4. Medication monitoring encounter  - Lipid panel - CBC with Differential/Platelet - COMPLETE METABOLIC PANEL WITH GFR  5. Memory difficulties -continue follow-ups with [name] - B12 and Folate Panel - TSH   Follow up plan: No follow-ups on file.

## 2021-12-17 DIAGNOSIS — H0288B Meibomian gland dysfunction left eye, upper and lower eyelids: Secondary | ICD-10-CM | POA: Diagnosis not present

## 2021-12-17 DIAGNOSIS — H2513 Age-related nuclear cataract, bilateral: Secondary | ICD-10-CM | POA: Diagnosis not present

## 2021-12-17 DIAGNOSIS — H0288A Meibomian gland dysfunction right eye, upper and lower eyelids: Secondary | ICD-10-CM | POA: Diagnosis not present

## 2021-12-17 DIAGNOSIS — H43812 Vitreous degeneration, left eye: Secondary | ICD-10-CM | POA: Diagnosis not present

## 2021-12-17 LAB — CBC WITH DIFFERENTIAL/PLATELET
Absolute Monocytes: 906 cells/uL (ref 200–950)
Basophils Absolute: 63 cells/uL (ref 0–200)
Basophils Relative: 1.1 %
Eosinophils Absolute: 171 cells/uL (ref 15–500)
Eosinophils Relative: 3 %
HCT: 44.4 % (ref 38.5–50.0)
Hemoglobin: 14.8 g/dL (ref 13.2–17.1)
Lymphs Abs: 1009 cells/uL (ref 850–3900)
MCH: 32 pg (ref 27.0–33.0)
MCHC: 33.3 g/dL (ref 32.0–36.0)
MCV: 95.9 fL (ref 80.0–100.0)
MPV: 9.4 fL (ref 7.5–12.5)
Monocytes Relative: 15.9 %
Neutro Abs: 3551 cells/uL (ref 1500–7800)
Neutrophils Relative %: 62.3 %
Platelets: 234 10*3/uL (ref 140–400)
RBC: 4.63 10*6/uL (ref 4.20–5.80)
RDW: 13.1 % (ref 11.0–15.0)
Total Lymphocyte: 17.7 %
WBC: 5.7 10*3/uL (ref 3.8–10.8)

## 2021-12-17 LAB — COMPLETE METABOLIC PANEL WITH GFR
AG Ratio: 1.9 (calc) (ref 1.0–2.5)
ALT: 13 U/L (ref 9–46)
AST: 18 U/L (ref 10–35)
Albumin: 4.6 g/dL (ref 3.6–5.1)
Alkaline phosphatase (APISO): 71 U/L (ref 35–144)
BUN: 19 mg/dL (ref 7–25)
CO2: 28 mmol/L (ref 20–32)
Calcium: 9.7 mg/dL (ref 8.6–10.3)
Chloride: 104 mmol/L (ref 98–110)
Creat: 0.86 mg/dL (ref 0.70–1.28)
Globulin: 2.4 g/dL (calc) (ref 1.9–3.7)
Glucose, Bld: 90 mg/dL (ref 65–99)
Potassium: 4.8 mmol/L (ref 3.5–5.3)
Sodium: 140 mmol/L (ref 135–146)
Total Bilirubin: 0.8 mg/dL (ref 0.2–1.2)
Total Protein: 7 g/dL (ref 6.1–8.1)
eGFR: 93 mL/min/{1.73_m2} (ref >=60)

## 2021-12-17 LAB — LIPID PANEL
Cholesterol: 166 mg/dL (ref <200)
HDL: 45 mg/dL (ref >=40)
LDL Cholesterol (Calc): 101 mg/dL (calc) — ABNORMAL HIGH
Non-HDL Cholesterol (Calc): 121 mg/dL (calc) (ref <130)
Total CHOL/HDL Ratio: 3.7 (calc) (ref <5.0)
Triglycerides: 102 mg/dL (ref <150)

## 2021-12-17 LAB — B12 AND FOLATE PANEL
Folate: 16 ng/mL
Vitamin B-12: 862 pg/mL (ref 200–1100)

## 2021-12-17 LAB — TSH: TSH: 1.87 mIU/L (ref 0.40–4.50)

## 2022-02-22 DIAGNOSIS — D400 Neoplasm of uncertain behavior of prostate: Secondary | ICD-10-CM | POA: Diagnosis not present

## 2022-02-22 DIAGNOSIS — N401 Enlarged prostate with lower urinary tract symptoms: Secondary | ICD-10-CM | POA: Diagnosis not present

## 2022-02-22 DIAGNOSIS — Z79899 Other long term (current) drug therapy: Secondary | ICD-10-CM | POA: Diagnosis not present

## 2022-02-23 DIAGNOSIS — N401 Enlarged prostate with lower urinary tract symptoms: Secondary | ICD-10-CM | POA: Diagnosis not present

## 2022-02-23 DIAGNOSIS — R972 Elevated prostate specific antigen [PSA]: Secondary | ICD-10-CM | POA: Diagnosis not present

## 2022-03-26 ENCOUNTER — Telehealth: Payer: Self-pay | Admitting: Nurse Practitioner

## 2022-03-26 NOTE — Telephone Encounter (Signed)
Copied from [place] [id number]. Topic: Medicare AWV ?>> Mar 26, 2022  9:22 AM Cher Nakai R wrote: ?Reason for CRM:  ?Left message for patient to call back and schedule Medicare Annual Wellness Visit (AWV) in office.  ? ?If unable to come into the office for AWV,  please offer to do virtually or by telephone. ? ?Last AWV: 03/11/2020 ? ?Please schedule at anytime with [place]. ? ?30 minute appointment for Virtual or phone ?45 minute appointment for in office or Initial virtual/phone ? ?Any questions, please contact me at [phone] ?

## 2022-04-07 DIAGNOSIS — R413 Other amnesia: Secondary | ICD-10-CM | POA: Diagnosis not present

## 2022-04-07 DIAGNOSIS — H9312 Tinnitus, left ear: Secondary | ICD-10-CM | POA: Diagnosis not present

## 2022-04-07 DIAGNOSIS — R569 Unspecified convulsions: Secondary | ICD-10-CM | POA: Diagnosis not present

## 2022-04-13 ENCOUNTER — Telehealth: Payer: Self-pay | Admitting: Nurse Practitioner

## 2022-04-13 NOTE — Telephone Encounter (Signed)
Copied from [place]. Topic: Medicare AWV ?>> Apr 13, 2022  2:48 PM Cher Nakai R wrote: ?Reason for CRM:  ?Left message for patient to call back and schedule Medicare Annual Wellness Visit (AWV) in office.  ? ?If unable to come into the office for AWV,  please offer to do virtually or by telephone. ? ?Last AWV: 03/11/2020 ? ?Please schedule at anytime with [place]. ? ?30 minute appointment for Virtual or phone ?45 minute appointment for in office or Initial virtual/phone ? ?Any questions, please contact me at [phone] ?

## 2022-05-19 ENCOUNTER — Telehealth: Payer: Self-pay | Admitting: Nurse Practitioner

## 2022-05-19 NOTE — Telephone Encounter (Signed)
Copied from [place] [id number]. Topic: Medicare AWV >> May 19, 2022 12:59 PM Cher Nakai R wrote: Reason for CRM:  Left message for patient to call back and schedule Medicare Annual Wellness Visit (AWV) in office.   If unable to come into the office for AWV,  please offer to do virtually or by telephone.  Last AWV: 03/11/2020  Please schedule at anytime with [place].  30 minute appointment for Virtual or phone 45 minute appointment for in office or Initial virtual/phone  Any questions, please contact me at [phone]

## 2022-07-02 ENCOUNTER — Other Ambulatory Visit: Payer: Self-pay | Admitting: Nurse Practitioner

## 2022-07-02 DIAGNOSIS — I10 Essential (primary) hypertension: Secondary | ICD-10-CM

## 2022-07-02 NOTE — Telephone Encounter (Signed)
Attempted to call patient to schedule 6 month follow up- left message to call office. Courtesy RF #[phone] given Requested Prescriptions  Pending Prescriptions Disp Refills  . metoprolol succinate (TOPROL-XL) 100 MG 24 hr tablet [Pharmacy Med Name: METOPROLOL SUCC ER 100 MG TAB] 90 tablet 1    Sig: TAKE 1 TABLET BY MOUTH EVERY DAY     Cardiovascular:  Beta Blockers Failed - 07/02/2022  2:11 AM      Failed - Valid encounter within last 6 months    Recent Outpatient Visits          6 months ago Essential hypertension   [facility] Bo Merino, FNP   9 months ago AA (alcohol abuse)   [facility] Myles Gip, DO   1 year ago Essential hypertension   [facility] Tow[place] Malkin, MD   2 years ago Annual physical exam   [place], [place], FNP   3 years ago [place], NP             Passed - Last BP in normal range    BP Readings from Last 1 Encounters:  12/16/21 122/76         Passed - Last Heart Rate in normal range    Pulse Readings from Last 1 Encounters:  12/16/21 83

## 2022-07-15 ENCOUNTER — Encounter: Payer: Self-pay | Admitting: Nurse Practitioner

## 2022-07-15 ENCOUNTER — Telehealth: Payer: Self-pay | Admitting: Nurse Practitioner

## 2022-07-15 ENCOUNTER — Ambulatory Visit (INDEPENDENT_AMBULATORY_CARE_PROVIDER_SITE_OTHER): Payer: Medicare HMO | Admitting: Nurse Practitioner

## 2022-07-15 VITALS — BP 118/72 | HR 76 | Temp 98.0°F | Resp 16 | Ht 71.0 in | Wt 175.7 lb

## 2022-07-15 DIAGNOSIS — M65351 Trigger finger, right little finger: Secondary | ICD-10-CM

## 2022-07-15 DIAGNOSIS — R413 Other amnesia: Secondary | ICD-10-CM | POA: Diagnosis not present

## 2022-07-15 DIAGNOSIS — I10 Essential (primary) hypertension: Secondary | ICD-10-CM | POA: Diagnosis not present

## 2022-07-15 DIAGNOSIS — E782 Mixed hyperlipidemia: Secondary | ICD-10-CM | POA: Diagnosis not present

## 2022-07-15 MED ORDER — ATORVASTATIN CALCIUM 10 MG PO TABS: 10.0000 mg | ORAL_TABLET | Freq: Every day | ORAL | 3 refills | Status: DC

## 2022-07-15 MED ORDER — METOPROLOL SUCCINATE ER 100 MG PO TB24: 100.0000 mg | ORAL_TABLET | Freq: Every day | ORAL | 3 refills | Status: DC

## 2022-07-15 NOTE — Assessment & Plan Note (Signed)
Currently receiving care from [name] neurology.  Patient currently taking Namenda 5 mg and Aricept 10 mg daily.

## 2022-07-15 NOTE — Assessment & Plan Note (Signed)
Blood pressure at goal.  Continue taking metoprolol 100 mg daily.

## 2022-07-15 NOTE — Telephone Encounter (Signed)
Copied from [place] [id number]. Topic: Referral - Status >> Jul 15, 2022  9:54 AM Tiffany B wrote: Reason for CRM: Caller would like [facility] clinic orthopedics Dorise Hiss C to call patient spouse # at [phone], primary number. Caller would like to know what number was given to specialist and please ensure its patient spouse number

## 2022-07-15 NOTE — Progress Notes (Signed)
BP 118/72   Pulse 76   Temp 98 F (36.7 C) (Oral)   Resp 16   Ht 5' 11" (1.803 m)   Wt 175 lb 11.2 oz (79.7 kg)   SpO2 98%   BMI 24.51 kg/m    Subjective:    Patient ID: Jeffrey Yates, male    DOB: 11-23-1951, 71 y.o.   MRN: [id number]  HPI: Jeffrey Yates is a 71 y.o. male  Chief Complaint  Patient presents with   Follow-up   Hypertension   Hypertension:His blood pressure today is 118/72.  He is currently taking metoprolol 100 mg daily.  Patient denies any chest pain, shortness of breath, headaches or blurred vision.  Hyperlipidemia: His last LDL was 101 on 12/16/2021.  Patient is not currently on any cholesterol-lowering medication.  Discussed his risk score patient has agreed to take atorvastatin 10 mg daily.  Prescription sent The 10-year ASCVD risk score (Arnett DK, et al., 2019) is: 21.1%   Values used to calculate the score:     Age: 81 years     Sex: Male     Is Non-Hispanic African American: No     Diabetic: No     Tobacco smoker: Yes     Systolic Blood Pressure: 229 mmHg     Is BP treated: Yes     HDL Cholesterol: 45 mg/dL     Total Cholesterol: 166 mg/dL   Memory loss: Patient currently sees [name] in neurology at [facility].  He last saw him on 04/07/2022.  He is currently taking Aricept 10 mg at night and Namenda 5 mg in the morning.  Condition is stable at this time.  Trigger finger: Patient has trigger fingers on his left hand that are now stuck in place.  Patient was told there was nothing that could be done to repair that at this time.  Patient states he is now noticed that he is starting to get it in his right hand.  He would like to get this fixed before it gets stuck.  We will place referral for orthopedics.  Relevant past medical, surgical, family and social history reviewed and updated as indicated. Interim medical history since our last visit reviewed. Allergies and medications reviewed and updated.  Review of Systems  Constitutional:  Negative for fever or weight change.  Respiratory: Negative for cough and shortness of breath.   Cardiovascular: Negative for chest pain or palpitations.  Gastrointestinal: Negative for abdominal pain, no bowel changes.  Musculoskeletal: Negative for gait problem or joint swelling.  Skin: Negative for rash.  Neurological: Negative for dizziness or headache.  No other specific complaints in a complete review of systems (except as listed in HPI above).      Objective:    BP 118/72   Pulse 76   Temp 98 F (36.7 C) (Oral)   Resp 16   Ht 5' 11" (1.803 m)   Wt 175 lb 11.2 oz (79.7 kg)   SpO2 98%   BMI 24.51 kg/m   Wt Readings from Last 3 Encounters:  07/15/22 175 lb 11.2 oz (79.7 kg)  12/16/21 186 lb 8 oz (84.6 kg)  09/15/21 178 lb 8 oz (81 kg)    Physical Exam  Constitutional: Patient appears well-developed and well-nourished. No distress.  HEENT: head atraumatic, normocephalic, pupils equal and reactive to light,  neck supple Cardiovascular: Normal rate, regular rhythm and normal heart sounds.  No murmur heard. No BLE edema. Pulmonary/Chest: Effort normal and breath sounds  normal. No respiratory distress. Abdominal: Soft.  There is no tenderness. MSK: Positive for trigger finger ( right hand 5th finger, left hand 4th and 5th finger) Psychiatric: Patient has a normal mood and affect. behavior is normal. Judgment and thought content normal.  Results for orders placed or performed in visit on 12/16/21  B12 and Folate Panel  Result Value Ref Range   Vitamin B-12 862 200 - 1,100 pg/mL   Folate 16.0 ng/mL  TSH  Result Value Ref Range   TSH 1.87 0.40 - 4.50 mIU/L  Lipid panel  Result Value Ref Range   Cholesterol 166 <200 mg/dL   HDL 45 > OR = 40 mg/dL   Triglycerides 102 <150 mg/dL   LDL Cholesterol (Calc) 101 (H) mg/dL (calc)   Total CHOL/HDL Ratio 3.7 <5.0 (calc)   Non-HDL Cholesterol (Calc) 121 <130 mg/dL (calc)  COMPLETE METABOLIC PANEL WITH GFR  Result Value Ref Range    Glucose, Bld 90 65 - 99 mg/dL   BUN 19 7 - 25 mg/dL   Creat 0.86 0.70 - 1.28 mg/dL   eGFR 93 > OR = 60 mL/min/1.45m   BUN/Creatinine Ratio NOT APPLICABLE 6 - 22 (calc)   Sodium 140 135 - 146 mmol/L   Potassium 4.8 3.5 - 5.3 mmol/L   Chloride 104 98 - 110 mmol/L   CO2 28 20 - 32 mmol/L   Calcium 9.7 8.6 - 10.3 mg/dL   Total Protein 7.0 6.1 - 8.1 g/dL   Albumin 4.6 3.6 - 5.1 g/dL   Globulin 2.4 1.9 - 3.7 g/dL (calc)   AG Ratio 1.9 1.0 - 2.5 (calc)   Total Bilirubin 0.8 0.2 - 1.2 mg/dL   Alkaline phosphatase (APISO) 71 35 - 144 U/L   AST 18 10 - 35 U/L   ALT 13 9 - 46 U/L  CBC with Differential/Platelet  Result Value Ref Range   WBC 5.7 3.8 - 10.8 Thousand/uL   RBC 4.63 4.20 - 5.80 Million/uL   Hemoglobin 14.8 13.2 - 17.1 g/dL   HCT 44.4 38.5 - 50.0 %   MCV 95.9 80.0 - 100.0 fL   MCH 32.0 27.0 - 33.0 pg   MCHC 33.3 32.0 - 36.0 g/dL   RDW 13.1 11.0 - 15.0 %   Platelets 234 140 - 400 Thousand/uL   MPV 9.4 7.5 - 12.5 fL   Neutro Abs 3,551 1,500 - 7,800 cells/uL   Lymphs Abs 1,009 850 - 3,900 cells/uL   Absolute Monocytes 906 200 - 950 cells/uL   Eosinophils Absolute 171 15 - 500 cells/uL   Basophils Absolute 63 0 - 200 cells/uL   Neutrophils Relative % 62.3 %   Total Lymphocyte 17.7 %   Monocytes Relative 15.9 %   Eosinophils Relative 3.0 %   Basophils Relative 1.1 %      Assessment & Plan:   Problem List Items Addressed This Visit       Cardiovascular and Mediastinum   Essential hypertension - Primary    Blood pressure at goal.  Continue taking metoprolol 100 mg daily.      Relevant Medications   atorvastatin (LIPITOR) 10 MG tablet   metoprolol succinate (TOPROL-XL) 100 MG 24 hr tablet     Other   Hyperlipidemia    Patient's LDL was up to 101 in April.  Discussed patient's risk score.  We will restart atorvastatin 10 mg daily.      Relevant Medications   atorvastatin (LIPITOR) 10 MG tablet   metoprolol succinate (TOPROL-XL) 100  MG 24 hr tablet   Memory  difficulties    Currently receiving care from [name] neurology.  Patient currently taking Namenda 5 mg and Aricept 10 mg daily.      Other Visit Diagnoses     Trigger little finger of right hand       Referral placed to orthopedics.   Relevant Orders   Ambulatory referral to Orthopedic Surgery        Follow up plan: Return in about 6 months (around 01/15/2023) for follow up.

## 2022-07-15 NOTE — Assessment & Plan Note (Signed)
Patient's LDL was up to 101 in April.  Discussed patient's risk score.  We will restart atorvastatin 10 mg daily.

## 2022-08-25 DIAGNOSIS — M720 Palmar fascial fibromatosis [Dupuytren]: Secondary | ICD-10-CM | POA: Diagnosis not present

## 2022-10-06 DIAGNOSIS — H6123 Impacted cerumen, bilateral: Secondary | ICD-10-CM | POA: Diagnosis not present

## 2022-10-06 DIAGNOSIS — H903 Sensorineural hearing loss, bilateral: Secondary | ICD-10-CM | POA: Diagnosis not present

## 2022-10-06 DIAGNOSIS — H9312 Tinnitus, left ear: Secondary | ICD-10-CM | POA: Diagnosis not present

## 2022-10-13 DIAGNOSIS — M720 Palmar fascial fibromatosis [Dupuytren]: Secondary | ICD-10-CM | POA: Diagnosis not present

## 2022-10-27 DIAGNOSIS — H9312 Tinnitus, left ear: Secondary | ICD-10-CM | POA: Diagnosis not present

## 2022-10-27 DIAGNOSIS — R413 Other amnesia: Secondary | ICD-10-CM | POA: Diagnosis not present

## 2022-12-15 ENCOUNTER — Other Ambulatory Visit: Payer: Self-pay | Admitting: Nurse Practitioner

## 2022-12-15 NOTE — Telephone Encounter (Signed)
Medication Refill - Medication: tamsulosin (FLOMAX) 0.4 MG CAPS capsule   Has the patient contacted their pharmacy? Yes.    Pt switched his Pharmacy. He use to use [facility] and is now using [facility]. All of [facility] medications [facility] except this medication and is wanting the refill to go to [facility].   Preferred Pharmacy (with phone number or street name):  [id number] [place] [address] Phone: [phone] Fax: [phone]   Has the patient been seen for an appointment in the last year OR does the patient have an upcoming appointment? Yes.    Agent: Please be advised that RX refills may take up to 3 business days. We ask that you follow-up with your pharmacy.

## 2022-12-15 NOTE — Telephone Encounter (Signed)
Requested medication (s) are due for refill today: Amount not specified  Requested medication (s) are on the active medication list: yes    Last refill: 11/22/2019  Amount not specified  Future visit scheduled yes 01/19/23  Notes to clinic:Historical provider, please review. Thank you.  Requested Prescriptions  Pending Prescriptions Disp Refills   tamsulosin (FLOMAX) 0.4 MG CAPS capsule 30 capsule     Sig: Take 1 capsule (0.4 mg total) by mouth daily.     Urology: Alpha-Adrenergic Blocker Failed - 12/15/2022  9:26 AM      Failed - PSA in normal range and within 360 days    PSA  Date Value Ref Range Status  11/23/2019 0.4 < OR = 4.0 ng/mL Final    Comment:    The total PSA value from this assay system is  standardized against the WHO standard. The test  result will be approximately 20% lower when compared  to the equimolar-standardized total PSA (Beckman  Coulter). Comparison of serial PSA results should be  interpreted with this fact in mind. . This test was performed using the Siemens  chemiluminescent method. Values obtained from  different assay methods cannot be used interchangeably. PSA levels, regardless of value, should not be interpreted as absolute evidence of the presence or absence of disease.          Passed - Last BP in normal range    BP Readings from Last 1 Encounters:  07/15/22 118/72         Passed - Valid encounter within last 12 months    Recent Outpatient Visits           5 months ago Essential hypertension   [facility] Bo Merino, FNP   12 months ago Essential hypertension   [place], FNP   1 year ago AA (alcohol abuse)   [facility] Myles Gip, DO   2 years ago Essential hypertension   [place], MD   3 years ago Annual physical exam   [facility] Hubbard Hartshorn, FNP       Future  Appointments             In 1 month Reece Packer, Myna Hidalgo, [facility], [facility]

## 2022-12-17 MED ORDER — TAMSULOSIN HCL 0.4 MG PO CAPS: 0.4000 mg | ORAL_CAPSULE | Freq: Every day | ORAL | 3 refills | Status: DC

## 2023-01-03 ENCOUNTER — Telehealth: Payer: Self-pay | Admitting: Nurse Practitioner

## 2023-01-03 NOTE — Telephone Encounter (Signed)
LVM for pt to rtn my call to schedule AWV with [facility]. Please schedule if patient calls the office.

## 2023-01-18 NOTE — Progress Notes (Unsigned)
There were no vitals taken for this visit.   Subjective:    Patient ID: Jeffrey Yates, male    DOB: Jun 27, 1951, 72 y.o.   MRN: [id number]  HPI: Jeffrey Yates is a 72 y.o. male  No chief complaint on file.  Hypertension: he is currently taking metoprolol 100 mg daily.   He denies any chest pain, headaches, blurred vision or shortness of breath.   Hyperlipidemia:His last LDL was 101 on 12/16/2021.  Patient prescribed atorvastatin 10 mg daily at last visit. Patient reports ***.    The 10-year ASCVD risk score (Arnett DK, et al., 2019) is: 23.6%   Values used to calculate the score:     Age: 66 years     Sex: Male     Is Non-Hispanic African American: No     Diabetic: No     Tobacco smoker: Yes     Systolic Blood Pressure: 654 mmHg     Is BP treated: Yes     HDL Cholesterol: 45 mg/dL     Total Cholesterol: 166 mg/dL   Memory loss: patient is established with [name] neurology at [facility].  He was last seen on 10/27/2022. He is currently taking namenda 5 mg in the am and aricept 10 mg at night. Condition stable.    Relevant past medical, surgical, family and social history reviewed and updated as indicated. Interim medical history since our last visit reviewed. Allergies and medications reviewed and updated.  Review of Systems  Constitutional: Negative for fever or weight change.  Respiratory: Negative for cough and shortness of breath.   Cardiovascular: Negative for chest pain or palpitations.  Gastrointestinal: Negative for abdominal pain, no bowel changes.  Musculoskeletal: Negative for gait problem or joint swelling.  Skin: Negative for rash.  Neurological: Negative for dizziness or headache.  No other specific complaints in a complete review of systems (except as listed in HPI above).      Objective:    There were no vitals taken for this visit.  Wt Readings from Last 3 Encounters:  07/15/22 175 lb 11.2 oz (79.7 kg)  12/16/21 186 lb 8 oz (84.6 kg)   09/15/21 178 lb 8 oz (81 kg)    Physical Exam  Constitutional: Patient appears well-developed and well-nourished. No distress.  HEENT: head atraumatic, normocephalic, pupils equal and reactive to light,  neck supple Cardiovascular: Normal rate, regular rhythm and normal heart sounds.  No murmur heard. No BLE edema. Pulmonary/Chest: Effort normal and breath sounds normal. No respiratory distress. Abdominal: Soft.  There is no tenderness. MSK: Positive for trigger finger ( right hand 5th finger, left hand 4th and 5th finger) Psychiatric: Patient has a normal mood and affect. behavior is normal. Judgment and thought content normal.  Results for orders placed or performed in visit on 12/16/21  B12 and Folate Panel  Result Value Ref Range   Vitamin B-12 862 200 - 1,100 pg/mL   Folate 16.0 ng/mL  TSH  Result Value Ref Range   TSH 1.87 0.40 - 4.50 mIU/L  Lipid panel  Result Value Ref Range   Cholesterol 166 <200 mg/dL   HDL 45 > OR = 40 mg/dL   Triglycerides 102 <150 mg/dL   LDL Cholesterol (Calc) 101 (H) mg/dL (calc)   Total CHOL/HDL Ratio 3.7 <5.0 (calc)   Non-HDL Cholesterol (Calc) 121 <130 mg/dL (calc)  COMPLETE METABOLIC PANEL WITH GFR  Result Value Ref Range   Glucose, Bld 90 65 - 99 mg/dL  BUN 19 7 - 25 mg/dL   Creat 0.86 0.70 - 1.28 mg/dL   eGFR 93 > OR = 60 mL/min/1.62m   BUN/Creatinine Ratio NOT APPLICABLE 6 - 22 (calc)   Sodium 140 135 - 146 mmol/L   Potassium 4.8 3.5 - 5.3 mmol/L   Chloride 104 98 - 110 mmol/L   CO2 28 20 - 32 mmol/L   Calcium 9.7 8.6 - 10.3 mg/dL   Total Protein 7.0 6.1 - 8.1 g/dL   Albumin 4.6 3.6 - 5.1 g/dL   Globulin 2.4 1.9 - 3.7 g/dL (calc)   AG Ratio 1.9 1.0 - 2.5 (calc)   Total Bilirubin 0.8 0.2 - 1.2 mg/dL   Alkaline phosphatase (APISO) 71 35 - 144 U/L   AST 18 10 - 35 U/L   ALT 13 9 - 46 U/L  CBC with Differential/Platelet  Result Value Ref Range   WBC 5.7 3.8 - 10.8 Thousand/uL   RBC 4.63 4.20 - 5.80 Million/uL   Hemoglobin 14.8  13.2 - 17.1 g/dL   HCT 44.4 38.5 - 50.0 %   MCV 95.9 80.0 - 100.0 fL   MCH 32.0 27.0 - 33.0 pg   MCHC 33.3 32.0 - 36.0 g/dL   RDW 13.1 11.0 - 15.0 %   Platelets 234 140 - 400 Thousand/uL   MPV 9.4 7.5 - 12.5 fL   Neutro Abs 3,551 1,500 - 7,800 cells/uL   Lymphs Abs 1,009 850 - 3,900 cells/uL   Absolute Monocytes 906 200 - 950 cells/uL   Eosinophils Absolute 171 15 - 500 cells/uL   Basophils Absolute 63 0 - 200 cells/uL   Neutrophils Relative % 62.3 %   Total Lymphocyte 17.7 %   Monocytes Relative 15.9 %   Eosinophils Relative 3.0 %   Basophils Relative 1.1 %      Assessment & Plan:   Problem List Items Addressed This Visit   None    Follow up plan: No follow-ups on file.

## 2023-01-19 ENCOUNTER — Other Ambulatory Visit: Payer: Self-pay

## 2023-01-19 ENCOUNTER — Encounter: Payer: Self-pay | Admitting: Nurse Practitioner

## 2023-01-19 ENCOUNTER — Ambulatory Visit (INDEPENDENT_AMBULATORY_CARE_PROVIDER_SITE_OTHER): Payer: Medicare HMO | Admitting: Nurse Practitioner

## 2023-01-19 VITALS — BP 124/78 | HR 78 | Temp 97.8°F | Resp 16 | Ht 71.0 in | Wt 165.1 lb

## 2023-01-19 DIAGNOSIS — L409 Psoriasis, unspecified: Secondary | ICD-10-CM | POA: Diagnosis not present

## 2023-01-19 DIAGNOSIS — Z1211 Encounter for screening for malignant neoplasm of colon: Secondary | ICD-10-CM

## 2023-01-19 DIAGNOSIS — E782 Mixed hyperlipidemia: Secondary | ICD-10-CM

## 2023-01-19 DIAGNOSIS — R7303 Prediabetes: Secondary | ICD-10-CM | POA: Diagnosis not present

## 2023-01-19 DIAGNOSIS — I10 Essential (primary) hypertension: Secondary | ICD-10-CM | POA: Diagnosis not present

## 2023-01-19 DIAGNOSIS — R413 Other amnesia: Secondary | ICD-10-CM | POA: Diagnosis not present

## 2023-01-19 DIAGNOSIS — Z1212 Encounter for screening for malignant neoplasm of rectum: Secondary | ICD-10-CM

## 2023-01-19 DIAGNOSIS — R7309 Other abnormal glucose: Secondary | ICD-10-CM | POA: Diagnosis not present

## 2023-01-19 DIAGNOSIS — Z131 Encounter for screening for diabetes mellitus: Secondary | ICD-10-CM

## 2023-01-19 MED ORDER — MOMETASONE FUROATE 0.1 % EX CREA: 1.0000 | TOPICAL_CREAM | Freq: Every day | CUTANEOUS | 1 refills | Status: DC

## 2023-01-19 NOTE — Assessment & Plan Note (Signed)
Continue taking namenda 5 mg in the am and aricept 10 mg at night.  Keep follow up appointments with [name].

## 2023-01-19 NOTE — Assessment & Plan Note (Signed)
He is currently taking atorvastatin 10 mg daily.  He denies any myalgia. Will get labs today.

## 2023-01-19 NOTE — Assessment & Plan Note (Signed)
Refill of mometasone cream sent in . He has a current flare on his right knee.

## 2023-01-19 NOTE — Assessment & Plan Note (Signed)
Continue taking metoprolol 100 mg daily.  His blood pressure is at goal 124/78.

## 2023-01-20 ENCOUNTER — Telehealth: Payer: Self-pay | Admitting: *Deleted

## 2023-01-20 LAB — CBC WITH DIFFERENTIAL/PLATELET
Absolute Monocytes: 621 cells/uL (ref 200–950)
Basophils Absolute: 29 cells/uL (ref 0–200)
Basophils Relative: 0.5 %
Eosinophils Absolute: 168 cells/uL (ref 15–500)
Eosinophils Relative: 2.9 %
HCT: 43.7 % (ref 38.5–50.0)
Hemoglobin: 14.8 g/dL (ref 13.2–17.1)
Lymphs Abs: 835 cells/uL — ABNORMAL LOW (ref 850–3900)
MCH: 31.6 pg (ref 27.0–33.0)
MCHC: 33.9 g/dL (ref 32.0–36.0)
MCV: 93.2 fL (ref 80.0–100.0)
MPV: 9.8 fL (ref 7.5–12.5)
Monocytes Relative: 10.7 %
Neutro Abs: 4147 cells/uL (ref 1500–7800)
Neutrophils Relative %: 71.5 %
Platelets: 213 10*3/uL (ref 140–400)
RBC: 4.69 10*6/uL (ref 4.20–5.80)
RDW: 11.9 % (ref 11.0–15.0)
Total Lymphocyte: 14.4 %
WBC: 5.8 10*3/uL (ref 3.8–10.8)

## 2023-01-20 LAB — COMPLETE METABOLIC PANEL WITH GFR
AG Ratio: 2.2 (calc) (ref 1.0–2.5)
ALT: 18 U/L (ref 9–46)
AST: 21 U/L (ref 10–35)
Albumin: 4.7 g/dL (ref 3.6–5.1)
Alkaline phosphatase (APISO): 68 U/L (ref 35–144)
BUN: 23 mg/dL (ref 7–25)
CO2: 25 mmol/L (ref 20–32)
Calcium: 10 mg/dL (ref 8.6–10.3)
Chloride: 104 mmol/L (ref 98–110)
Creat: 0.9 mg/dL (ref 0.70–1.28)
Globulin: 2.1 g/dL (calc) (ref 1.9–3.7)
Glucose, Bld: 89 mg/dL (ref 65–99)
Potassium: 5.1 mmol/L (ref 3.5–5.3)
Sodium: 143 mmol/L (ref 135–146)
Total Bilirubin: 1.6 mg/dL — ABNORMAL HIGH (ref 0.2–1.2)
Total Protein: 6.8 g/dL (ref 6.1–8.1)
eGFR: 91 mL/min/{1.73_m2} (ref >=60)

## 2023-01-20 LAB — LIPID PANEL
Cholesterol: 133 mg/dL (ref <200)
HDL: 49 mg/dL (ref >=40)
LDL Cholesterol (Calc): 67 mg/dL (calc)
Non-HDL Cholesterol (Calc): 84 mg/dL (calc) (ref <130)
Total CHOL/HDL Ratio: 2.7 (calc) (ref <5.0)
Triglycerides: 83 mg/dL (ref <150)

## 2023-01-20 LAB — HEMOGLOBIN A1C
Hgb A1c MFr Bld: 5.7 % of total Hgb — ABNORMAL HIGH (ref <5.7)
Mean Plasma Glucose: 117 mg/dL
eAG (mmol/L): 6.5 mmol/L

## 2023-01-20 NOTE — Telephone Encounter (Signed)
Patient's wife,Nova(DPR) called for results- they have review ed in MyChart:  Jeffrey Yates, your cholesterol has really improved. It went form 101 to 67.  Great job, continue taking atorvastatin.  A1c was 5.7 this puts you in the prediabetic range.  Recommend decreasing sugar and processed foods in your diet.  Your blood counts, kidney and liver function are normal   Notified and questions answered.

## 2023-02-01 ENCOUNTER — Telehealth: Payer: Self-pay | Admitting: Nurse Practitioner

## 2023-02-01 NOTE — Telephone Encounter (Signed)
Called patient to schedule Medicare Annual Wellness Visit (AWV). Left message for patient to call back and schedule Medicare Annual Wellness Visit (AWV).  Last date of AWV: 03/01/2020  Please schedule an appointment at any time with [facility].  If any questions, please contact me at [phone].  Thank you ,  [place] Direct Dial: [phone]

## 2023-02-02 ENCOUNTER — Telehealth: Payer: Self-pay | Admitting: Nurse Practitioner

## 2023-02-02 NOTE — Telephone Encounter (Signed)
Contacted Jeffrey Yates to schedule their annual wellness visit. Patient declined to schedule AWV at this time.  [place] Direct Dial: [phone]

## 2023-04-13 DIAGNOSIS — R413 Other amnesia: Secondary | ICD-10-CM | POA: Diagnosis not present

## 2023-04-13 DIAGNOSIS — H9312 Tinnitus, left ear: Secondary | ICD-10-CM | POA: Diagnosis not present

## 2023-07-20 ENCOUNTER — Other Ambulatory Visit: Payer: Self-pay

## 2023-07-20 ENCOUNTER — Ambulatory Visit (INDEPENDENT_AMBULATORY_CARE_PROVIDER_SITE_OTHER): Payer: Medicare HMO | Admitting: Nurse Practitioner

## 2023-07-20 ENCOUNTER — Encounter: Payer: Self-pay | Admitting: Nurse Practitioner

## 2023-07-20 VITALS — BP 120/72 | HR 77 | Temp 98.2°F | Resp 16 | Ht 71.0 in | Wt 181.0 lb

## 2023-07-20 DIAGNOSIS — L409 Psoriasis, unspecified: Secondary | ICD-10-CM | POA: Diagnosis not present

## 2023-07-20 DIAGNOSIS — R413 Other amnesia: Secondary | ICD-10-CM

## 2023-07-20 DIAGNOSIS — N400 Enlarged prostate without lower urinary tract symptoms: Secondary | ICD-10-CM | POA: Diagnosis not present

## 2023-07-20 DIAGNOSIS — E782 Mixed hyperlipidemia: Secondary | ICD-10-CM

## 2023-07-20 DIAGNOSIS — I10 Essential (primary) hypertension: Secondary | ICD-10-CM | POA: Diagnosis not present

## 2023-07-20 MED ORDER — METOPROLOL SUCCINATE ER 100 MG PO TB24: 100.0000 mg | ORAL_TABLET | Freq: Every day | ORAL | 3 refills | Status: DC

## 2023-07-20 MED ORDER — TAMSULOSIN HCL 0.4 MG PO CAPS: 0.4000 mg | ORAL_CAPSULE | Freq: Every day | ORAL | 3 refills | Status: DC

## 2023-07-20 MED ORDER — MOMETASONE FUROATE 0.1 % EX CREA: 1.0000 | TOPICAL_CREAM | Freq: Every day | CUTANEOUS | 1 refills | Status: AC

## 2023-07-20 NOTE — Assessment & Plan Note (Signed)
Continue atorvastatin 10 mg daily. 

## 2023-07-20 NOTE — Assessment & Plan Note (Signed)
Continue metoprolol 100 mg daily. 

## 2023-07-20 NOTE — Progress Notes (Signed)
BP 120/72   Pulse 77   Temp 98.2 F (36.8 C) (Oral)   Resp 16   Ht 5\' 11"  (1.803 m)   Wt 181 lb (82.1 kg)   SpO2 97%   BMI 25.24 kg/m    Subjective:    Patient ID: Jeffrey Yates, male    DOB: 1951-10-15, 72 y.o.   MRN: [id number]  HPI: ANTIONO Yates is a 72 y.o. male  Chief Complaint  Patient presents with   Hypertension   Hyperlipidemia   Memory Loss    6 month follow up   Hypertension:  -Medications: metoprolol 100 mg daily -Patient is compliant with above medications and reports no side effects. -Checking BP at home (average): no, blood pressure -Denies any SOB, CP, vision changes, LE edema or symptoms of hypotension -Diet: recommend DASH diet  -Exercise: he walks about 3--3.5 miles a day.  And is feeling good   HLD:  -Medications: atorvastatin 10 mg daily -Patient is compliant with above medications and reports no side effects.  -Last lipid panel:  Lipid Panel     Component Value Date/Time   CHOL 133 01/19/2023 0851   CHOL 157 05/19/2016 0806   TRIG 83 01/19/2023 0851   HDL 49 01/19/2023 0851   HDL 63 05/19/2016 0806   CHOLHDL 2.7 01/19/2023 0851   VLDL 20 02/14/2017 1155   LDLCALC 67 01/19/2023 0851   LABVLDL 25 05/19/2016 0806      The 10-year ASCVD risk score (Arnett DK, et al., 2019) is: 19.9%   Values used to calculate the score:     Age: 37 years     Sex: Male     Is Non-Hispanic African American: No     Diabetic: No     Tobacco smoker: Yes     Systolic Blood Pressure: 120 mmHg     Is BP treated: Yes     HDL Cholesterol: 49 mg/dL     Total Cholesterol: 133 mg/dL   Memory loss: patient is established with Dr. Malvin Johns neurology at [place] [facility].  He was last seen on 04/13/2023. He is currently taking namenda 5 mg BID and aricept 10 mg at night. Stable at this time.  Namenda was increased to BID. No other changes.   Psoriasis: he uses mometasone when he has a flare. He says that it has flared up on his right knee. Will send in a  refill  Bph: doing well. Currently not having symptoms. Taking flomax 0.4 mg daily.  Refill sent  Relevant past medical, surgical, family and social history reviewed and updated as indicated. Interim medical history since our last visit reviewed. Allergies and medications reviewed and updated.  Review of Systems  Constitutional: Negative for fever or weight change.  Respiratory: Negative for cough and shortness of breath.   Cardiovascular: Negative for chest pain or palpitations.  Gastrointestinal: Negative for abdominal pain, no bowel changes.  Musculoskeletal: Negative for gait problem or joint swelling.  Skin: Negative for rash.  Neurological: Negative for dizziness or headache.  No other specific complaints in a complete review of systems (except as listed in HPI above).      Objective:    BP 120/72   Pulse 77   Temp 98.2 F (36.8 C) (Oral)   Resp 16   Ht 5\' 11"  (1.803 m)   Wt 181 lb (82.1 kg)   SpO2 97%   BMI 25.24 kg/m   Wt Readings from Last 3 Encounters:  07/20/23 181 lb (82.1 kg)  01/19/23 165 lb 1.6 oz (74.9 kg)  07/15/22 175 lb 11.2 oz (79.7 kg)    Physical Exam  Constitutional: Patient appears well-developed and well-nourished. No distress.  HEENT: head atraumatic, normocephalic, pupils equal and reactive to light,  neck supple Cardiovascular: Normal rate, regular rhythm and normal heart sounds.  No murmur heard. No BLE edema. Pulmonary/Chest: Effort normal and breath sounds normal. No respiratory distress. Abdominal: Soft.  There is no tenderness. MSK: Positive for trigger finger ( right hand 5th finger, left hand 4th and 5th finger) Psychiatric: Patient has a normal mood and affect. behavior is normal. Judgment and thought content normal.  Results for orders placed or performed in visit on 01/19/23  CBC with Differential/Platelet  Result Value Ref Range   WBC 5.8 3.8 - 10.8 Thousand/uL   RBC 4.69 4.20 - 5.80 Million/uL   Hemoglobin 14.8 13.2 - 17.1 g/dL    HCT 82.9 56.2 - 13.0 %   MCV 93.2 80.0 - 100.0 fL   MCH 31.6 27.0 - 33.0 pg   MCHC 33.9 32.0 - 36.0 g/dL   RDW 86.5 78.4 - 69.6 %   Platelets 213 140 - 400 Thousand/uL   MPV 9.8 7.5 - 12.5 fL   Neutro Abs 4,147 1,500 - 7,800 cells/uL   Lymphs Abs 835 (L) 850 - 3,900 cells/uL   Absolute Monocytes 621 200 - 950 cells/uL   Eosinophils Absolute 168 15 - 500 cells/uL   Basophils Absolute 29 0 - 200 cells/uL   Neutrophils Relative % 71.5 %   Total Lymphocyte 14.4 %   Monocytes Relative 10.7 %   Eosinophils Relative 2.9 %   Basophils Relative 0.5 %  COMPLETE METABOLIC PANEL WITH GFR  Result Value Ref Range   Glucose, Bld 89 65 - 99 mg/dL   BUN 23 7 - 25 mg/dL   Creat 2.95 2.84 - 1.32 mg/dL   eGFR 91 > OR = 60 GM/WNU/2.72Z3   BUN/Creatinine Ratio SEE NOTE: 6 - 22 (calc)   Sodium 143 135 - 146 mmol/L   Potassium 5.1 3.5 - 5.3 mmol/L   Chloride 104 98 - 110 mmol/L   CO2 25 20 - 32 mmol/L   Calcium 10.0 8.6 - 10.3 mg/dL   Total Protein 6.8 6.1 - 8.1 g/dL   Albumin 4.7 3.6 - 5.1 g/dL   Globulin 2.1 1.9 - 3.7 g/dL (calc)   AG Ratio 2.2 1.0 - 2.5 (calc)   Total Bilirubin 1.6 (H) 0.2 - 1.2 mg/dL   Alkaline phosphatase (APISO) 68 35 - 144 U/L   AST 21 10 - 35 U/L   ALT 18 9 - 46 U/L  Lipid panel  Result Value Ref Range   Cholesterol 133 <200 mg/dL   HDL 49 > OR = 40 mg/dL   Triglycerides 83 <664 mg/dL   LDL Cholesterol (Calc) 67 mg/dL (calc)   Total CHOL/HDL Ratio 2.7 <5.0 (calc)   Non-HDL Cholesterol (Calc) 84 <403 mg/dL (calc)  Hemoglobin K7Q  Result Value Ref Range   Hgb A1c MFr Bld 5.7 (H) <5.7 % of total Hgb   Mean Plasma Glucose 117 mg/dL   eAG (mmol/L) 6.5 mmol/L      Assessment & Plan:   Problem List Items Addressed This Visit       Cardiovascular and Mediastinum   Essential hypertension    Continue metoprolol 100 mg daily      Relevant Medications   metoprolol succinate (TOPROL-XL) 100 MG 24 hr tablet     Musculoskeletal and  Integument   Psoriasis     Refill of mometasone cream sent in .  He reports he is doing well. He usually uses it on his knees and elbows.       Relevant Medications   mometasone (ELOCON) 0.1 % cream     Genitourinary   Benign non-nodular prostatic hyperplasia without lower urinary tract symptoms - Primary    Currently on flomax 0.4 mg daily. stable      Relevant Medications   tamsulosin (FLOMAX) 0.4 MG CAPS capsule     Other   Hyperlipidemia    Continue atorvastatin 10 mg daily.       Relevant Medications   metoprolol succinate (TOPROL-XL) 100 MG 24 hr tablet   Memory difficulties    Managed by neurology, doing well         Follow up plan: Return in about 1 year (around 07/19/2024) for follow up.

## 2023-07-20 NOTE — Assessment & Plan Note (Signed)
Refill of mometasone cream sent in .  He reports he is doing well. He usually uses it on his knees and elbows.

## 2023-07-20 NOTE — Assessment & Plan Note (Signed)
Currently on flomax 0.4 mg daily. stable

## 2023-07-20 NOTE — Assessment & Plan Note (Signed)
Managed by neurology, doing well

## 2023-08-07 ENCOUNTER — Other Ambulatory Visit: Payer: Self-pay | Admitting: Nurse Practitioner

## 2023-08-07 DIAGNOSIS — E782 Mixed hyperlipidemia: Secondary | ICD-10-CM

## 2023-08-09 NOTE — Telephone Encounter (Signed)
Requested Prescriptions  Pending Prescriptions Disp Refills   atorvastatin (LIPITOR) 10 MG tablet [Pharmacy Med Name: ATORVASTATIN 10 MG TABLET] 90 tablet 3    Sig: TAKE 1 TABLET BY MOUTH EVERY DAY     Cardiovascular:  Antilipid - Statins Failed - 08/07/2023  3:56 PM      Failed - Lipid Panel in normal range within the last 12 months    Cholesterol, Total  Date Value Ref Range Status  05/19/2016 157 100 - 199 mg/dL Final   Cholesterol  Date Value Ref Range Status  01/19/2023 133 <200 mg/dL Final   LDL Cholesterol (Calc)  Date Value Ref Range Status  01/19/2023 67 mg/dL (calc) Final    Comment:    Reference range: <100 . Desirable range <100 mg/dL for primary prevention;   <70 mg/dL for patients with CHD or diabetic patients  with > or = 2 CHD risk factors. Marland Kitchen LDL-C is now calculated using the Martin-Hopkins  calculation, which is a validated novel method providing  better accuracy than the Friedewald equation in the  estimation of LDL-C.  Horald Pollen et al. Lenox Ahr. 4098;119(14): 2061-2068  (http://education.[facility].com/faq/FAQ164)    HDL  Date Value Ref Range Status  01/19/2023 49 > OR = 40 mg/dL Final  78/29/5621 63 >30 mg/dL Final   Triglycerides  Date Value Ref Range Status  01/19/2023 83 <150 mg/dL Final         Passed - Patient is not pregnant      Passed - Valid encounter within last 12 months    Recent Outpatient Visits           2 weeks ago Benign non-nodular prostatic hyperplasia without lower urinary tract symptoms   [facility] Berniece Salines, FNP   6 months ago Memory difficulties   [facility] Berniece Salines, FNP   1 year ago Essential hypertension   [facility] Berniece Salines, FNP   1 year ago Essential hypertension   [facility] Berniece Salines, FNP   1 year ago AA (alcohol abuse)   [facility] Caro Laroche, DO       Future Appointments             In 11 months Zane Herald, Rudolpho Sevin, FNP [facility], [facility]

## 2023-10-12 DIAGNOSIS — R413 Other amnesia: Secondary | ICD-10-CM | POA: Diagnosis not present

## 2023-10-12 DIAGNOSIS — F22 Delusional disorders: Secondary | ICD-10-CM | POA: Diagnosis not present

## 2024-01-18 ENCOUNTER — Ambulatory Visit (INDEPENDENT_AMBULATORY_CARE_PROVIDER_SITE_OTHER): Payer: Medicare HMO | Admitting: Family Medicine

## 2024-01-18 ENCOUNTER — Encounter: Payer: Self-pay | Admitting: Family Medicine

## 2024-01-18 VITALS — BP 126/84 | HR 77 | Resp 16 | Ht 71.0 in | Wt 170.0 lb

## 2024-01-18 DIAGNOSIS — L989 Disorder of the skin and subcutaneous tissue, unspecified: Secondary | ICD-10-CM

## 2024-01-18 DIAGNOSIS — L409 Psoriasis, unspecified: Secondary | ICD-10-CM

## 2024-01-18 NOTE — Progress Notes (Signed)
 Patient ID: Jeffrey Yates, male    DOB: Jul 14, 1951, 73 y.o.   MRN: [id number]  PCP: Gareth Mliss FALCON, FNP  Chief Complaint  Patient presents with   Nevus    R upper shoulder/neck. No pain or problems.     Subjective:   Jeffrey Yates is a 73 y.o. male, presents to clinic with CC of the following:  HPI  patient presents for a skin lesion that he states is new over the last 3 to 4 weeks located to his left upper shoulder/back Its raised but flat and dry, brown He has a few other spots similar to this on his trunk and back but none is big or is raised He denies any itching or discomfort No trauma or bleeding to the lesion  He previously was established at [facility] but it was many many years ago he has a history of psoriasis for which she is managing with prescription topical steroid ointment, no systemic treatments currently he has a few patches and plaques also scattered to his trunk and extremities, he recently had a diffuse rash which they believe is reaction to medication No history of skin cancer Unknown last skin survey or dermatology consult his wife states it may have been 30 years ago   Patient Active Problem List   Diagnosis Date Noted   Tubular adenoma of colon 07/23/2020   Memory difficulties 01/10/2019   Gilbert's disease 05/17/2018   Insomnia 05/15/2018   Neuralgia neuritis, sciatic nerve 06/29/2016   Dupuytren's contracture 05/31/2016   Snoring 05/26/2016   Gout 09/22/2015   Psoriasis 09/22/2015   Essential hypertension 07/14/2015   Hyperlipidemia 07/14/2015   Allergic rhinitis 07/14/2015   Benign non-nodular prostatic hyperplasia without lower urinary tract symptoms 11/26/2014   Alcohol abuse, uncomplicated 11/26/2014      Current Outpatient Medications:    ascorbic acid (VITAMIN C) 1000 MG tablet, Take 1,000 mg by mouth daily., Disp: , Rfl:    atorvastatin  (LIPITOR) 10 MG tablet, TAKE 1 TABLET BY MOUTH EVERY DAY, Disp: 90 tablet, Rfl: 3    Cholecalciferol (VITAMIN D3) 1000 UNITS CAPS, Take 1 capsule by mouth., Disp: , Rfl:    donepezil (ARICEPT) 10 MG tablet, Take 10 mg by mouth at bedtime., Disp: , Rfl:    dutasteride  (AVODART ) 0.5 MG capsule, Take 0.5 mg by mouth daily., Disp: , Rfl:    memantine (NAMENDA) 5 MG tablet, Take 5 mg by mouth every morning., Disp: , Rfl:    metoprolol  succinate (TOPROL -XL) 100 MG 24 hr tablet, Take 1 tablet (100 mg total) by mouth daily., Disp: 90 tablet, Rfl: 3   mometasone  (ELOCON ) 0.1 % cream, Apply 1 Application topically daily. As needed for flares, use sparingly, Disp: 45 g, Rfl: 1   tamsulosin  (FLOMAX ) 0.4 MG CAPS capsule, Take 1 capsule (0.4 mg total) by mouth daily., Disp: 90 capsule, Rfl: 3   vitamin B-12 (CYANOCOBALAMIN) 1000 MCG tablet, Take 1,000 mcg by mouth daily., Disp: , Rfl:    Allergies  Allergen Reactions   Aricept [Donepezil Hcl] Rash   Namenda [Memantine Hcl] Rash     Social History   Tobacco Use   Smoking status: Light Smoker    Current packs/day: 0.25    Average packs/day: 0.3 packs/day for 35.0 years (8.8 ttl pk-yrs)    Types: Cigarettes   Smokeless tobacco: Never   Tobacco comments:    1 PPWeek, Smoked 10 yrs, quit for 17, restarted 1.5 yrs ago. 1-2 cigarettes per day as  of 03/11/20  Vaping Use   Vaping status: Never Used  Substance Use Topics   Alcohol use: Yes    Alcohol/week: 5.0 standard drinks of alcohol    Types: 5 Shots of liquor per week    Comment: daily   Drug use: No      Chart Review Today: I personally reviewed active problem list, medication list, allergies, family history, social history, health maintenance, notes from last encounter, lab results, imaging with the patient/caregiver today.   Review of Systems  Constitutional: Negative.   HENT: Negative.    Eyes: Negative.   Respiratory: Negative.    Cardiovascular: Negative.   Gastrointestinal: Negative.   Endocrine: Negative.   Genitourinary: Negative.   Musculoskeletal: Negative.    Skin: Negative.   Allergic/Immunologic: Negative.   Neurological: Negative.   Hematological: Negative.   Psychiatric/Behavioral: Negative.    All other systems reviewed and are negative.      Objective:   Vitals:   01/18/24 0940  BP: 126/84  Pulse: 77  Resp: 16  SpO2: 97%  Weight: 170 lb (77.1 kg)  Height: 5' 11 (1.803 m)    Body mass index is 23.71 kg/m.  Physical Exam Vitals and nursing note reviewed.  Constitutional:      Appearance: He is well-developed.  HENT:     Head: Normocephalic and atraumatic.     Nose: Nose normal.  Eyes:     General:        Right eye: No discharge.        Left eye: No discharge.     Conjunctiva/sclera: Conjunctivae normal.  Neck:     Trachea: No tracheal deviation.  Cardiovascular:     Rate and Rhythm: Normal rate and regular rhythm.  Pulmonary:     Effort: Pulmonary effort is normal. No respiratory distress.     Breath sounds: No stridor.  Skin:    General: Skin is warm and dry.     Findings: Lesion (right upper back/shoulder, about 1.5 cm diameter round raised flat dry brown plaque) and rash (scattered rash to back with different morphology, some flat brown patches/macules, some plaquelike scaly white to erythematous areas) present.  Neurological:     Mental Status: He is alert.     Motor: No abnormal muscle tone.     Coordination: Coordination normal.  Psychiatric:        Behavior: Behavior normal.          Results for orders placed or performed in visit on 01/19/23  CBC with Differential/Platelet   Collection Time: 01/19/23  8:51 AM  Result Value Ref Range   WBC 5.8 3.8 - 10.8 Thousand/uL   RBC 4.69 4.20 - 5.80 Million/uL   Hemoglobin 14.8 13.2 - 17.1 g/dL   HCT 56.2 61.4 - 49.9 %   MCV 93.2 80.0 - 100.0 fL   MCH 31.6 27.0 - 33.0 pg   MCHC 33.9 32.0 - 36.0 g/dL   RDW 88.0 88.9 - 84.9 %   Platelets 213 140 - 400 Thousand/uL   MPV 9.8 7.5 - 12.5 fL   Neutro Abs 4,147 1,500 - 7,800 cells/uL   Lymphs Abs 835 (L)  850 - 3,900 cells/uL   Absolute Monocytes 621 200 - 950 cells/uL   Eosinophils Absolute 168 15 - 500 cells/uL   Basophils Absolute 29 0 - 200 cells/uL   Neutrophils Relative % 71.5 %   Total Lymphocyte 14.4 %   Monocytes Relative 10.7 %   Eosinophils Relative 2.9 %  Basophils Relative 0.5 %  COMPLETE METABOLIC PANEL WITH GFR   Collection Time: 01/19/23  8:51 AM  Result Value Ref Range   Glucose, Bld 89 65 - 99 mg/dL   BUN 23 7 - 25 mg/dL   Creat 9.09 9.29 - 8.71 mg/dL   eGFR 91 > OR = 60 fO/fpw/8.26f7   BUN/Creatinine Ratio SEE NOTE: 6 - 22 (calc)   Sodium 143 135 - 146 mmol/L   Potassium 5.1 3.5 - 5.3 mmol/L   Chloride 104 98 - 110 mmol/L   CO2 25 20 - 32 mmol/L   Calcium  10.0 8.6 - 10.3 mg/dL   Total Protein 6.8 6.1 - 8.1 g/dL   Albumin 4.7 3.6 - 5.1 g/dL   Globulin 2.1 1.9 - 3.7 g/dL (calc)   AG Ratio 2.2 1.0 - 2.5 (calc)   Total Bilirubin 1.6 (H) 0.2 - 1.2 mg/dL   Alkaline phosphatase (APISO) 68 35 - 144 U/L   AST 21 10 - 35 U/L   ALT 18 9 - 46 U/L  Lipid panel   Collection Time: 01/19/23  8:51 AM  Result Value Ref Range   Cholesterol 133 <200 mg/dL   HDL 49 > OR = 40 mg/dL   Triglycerides 83 <849 mg/dL   LDL Cholesterol (Calc) 67 mg/dL (calc)   Total CHOL/HDL Ratio 2.7 <5.0 (calc)   Non-HDL Cholesterol (Calc) 84 <869 mg/dL (calc)  Hemoglobin J8r   Collection Time: 01/19/23  8:51 AM  Result Value Ref Range   Hgb A1c MFr Bld 5.7 (H) <5.7 % of total Hgb   Mean Plasma Glucose 117 mg/dL   eAG (mmol/L) 6.5 mmol/L       Assessment & Plan:   1. Skin lesion (Primary) New lesion/plaque to upper right back/ upper trapezius/shoulder area Looks most like seborrheic keratosis, no trauma, refer to derm for eval Pt's wife asks for [facility], did explain long wait times for new pts, they may want to ask insurance for in network providers and call around, or try [facility] - Ambulatory referral to Dermatology  2. Psoriasis scattered plaques  to back, managing with PCP with topical steroid ointment     Michelene Cower, PA-C 01/18/24 10:02 AM

## 2024-01-30 DIAGNOSIS — D225 Melanocytic nevi of trunk: Secondary | ICD-10-CM | POA: Diagnosis not present

## 2024-01-30 DIAGNOSIS — L400 Psoriasis vulgaris: Secondary | ICD-10-CM | POA: Diagnosis not present

## 2024-01-30 DIAGNOSIS — L570 Actinic keratosis: Secondary | ICD-10-CM | POA: Diagnosis not present

## 2024-01-30 DIAGNOSIS — L821 Other seborrheic keratosis: Secondary | ICD-10-CM | POA: Diagnosis not present

## 2024-03-07 ENCOUNTER — Other Ambulatory Visit: Payer: Self-pay | Admitting: Nurse Practitioner

## 2024-03-07 NOTE — Telephone Encounter (Signed)
 Copied from CRM [id number]. Topic: Clinical - Medication Refill >> Mar 07, 2024 11:04 AM Gildardo Pounds wrote: Most Recent Primary Care Visit:  Provider: Danelle Berry  Department: [facility]  Visit Type: OFFICE VISIT  Date: 01/18/2024  Medication: dutasteride (AVODART) 0.5 MG capsule  Has the patient contacted their pharmacy? No (Agent: If no, request that the patient contact the pharmacy for the refill. If patient does not wish to contact the pharmacy document the reason why and proceed with request.) (Agent: If yes, when and what did the pharmacy advise?)  Is this the correct pharmacy for this prescription? Yes If no, delete pharmacy and type the correct one.  This is the patient's preferred pharmacy:  [id number] - [place], [place] - [address] [place] [address] Phone: [phone] Fax: [phone]   Has the prescription been filled recently? No  Is the patient out of the medication? Yes  Has the patient been seen for an appointment in the last year OR does the patient have an upcoming appointment? Yes  Can we respond through MyChart? Yes  Agent: Please be advised that Rx refills may take up to 3 business days. We ask that you follow-up with your pharmacy.

## 2024-03-08 NOTE — Telephone Encounter (Signed)
 Requested medication (s) are due for refill today -unsure  Requested medication (s) are on the active medication list -yes  Future visit scheduled -yes  Last refill: unknown  Notes to clinic: listed as historical medication   Requested Prescriptions  Pending Prescriptions Disp Refills   dutasteride (AVODART) 0.5 MG capsule      Sig: Take 1 capsule (0.5 mg total) by mouth daily.     Urology: 5-alpha Reductase Inhibitors Failed - 03/08/2024  4:06 PM      Failed - PSA in normal range and within 360 days    PSA  Date Value Ref Range Status  11/23/2019 0.4 < OR = 4.0 ng/mL Final    Comment:    The total PSA value from this assay system is  standardized against the WHO standard. The test  result will be approximately 20% lower when compared  to the equimolar-standardized total PSA (Beckman  Coulter). Comparison of serial PSA results should be  interpreted with this fact in mind. . This test was performed using the Siemens  chemiluminescent method. Values obtained from  different assay methods cannot be used interchangeably. PSA levels, regardless of value, should not be interpreted as absolute evidence of the presence or absence of disease.          Passed - Valid encounter within last 12 months    Recent Outpatient Visits           1 month ago Skin lesion   [facility] Danelle Berry, PA-C       Future Appointments             In 4 months Zane Herald Rudolpho Sevin, FNP [facility], [facility]               Requested Prescriptions  Pending Prescriptions Disp Refills   dutasteride (AVODART) 0.5 MG capsule      Sig: Take 1 capsule (0.5 mg total) by mouth daily.     Urology: 5-alpha Reductase Inhibitors Failed - 03/08/2024  4:06 PM      Failed - PSA in normal range and within 360 days    PSA  Date Value Ref Range Status  11/23/2019 0.4 < OR = 4.0 ng/mL Final    Comment:    The total PSA value from this assay system is   standardized against the WHO standard. The test  result will be approximately 20% lower when compared  to the equimolar-standardized total PSA (Beckman  Coulter). Comparison of serial PSA results should be  interpreted with this fact in mind. . This test was performed using the Siemens  chemiluminescent method. Values obtained from  different assay methods cannot be used interchangeably. PSA levels, regardless of value, should not be interpreted as absolute evidence of the presence or absence of disease.          Passed - Valid encounter within last 12 months    Recent Outpatient Visits           1 month ago Skin lesion   [facility] Danelle Berry, PA-C       Future Appointments             In 4 months Zane Herald, Rudolpho Sevin, FNP [facility], [facility]

## 2024-03-09 MED ORDER — DUTASTERIDE 0.5 MG PO CAPS: 0.5000 mg | ORAL_CAPSULE | Freq: Every day | ORAL | 1 refills | Status: DC

## 2024-04-11 DIAGNOSIS — R413 Other amnesia: Secondary | ICD-10-CM | POA: Diagnosis not present

## 2024-04-11 DIAGNOSIS — H9312 Tinnitus, left ear: Secondary | ICD-10-CM | POA: Diagnosis not present

## 2024-04-11 DIAGNOSIS — F22 Delusional disorders: Secondary | ICD-10-CM | POA: Diagnosis not present

## 2024-06-12 DIAGNOSIS — H2513 Age-related nuclear cataract, bilateral: Secondary | ICD-10-CM | POA: Diagnosis not present

## 2024-06-12 DIAGNOSIS — H0288A Meibomian gland dysfunction right eye, upper and lower eyelids: Secondary | ICD-10-CM | POA: Diagnosis not present

## 2024-06-12 DIAGNOSIS — H43812 Vitreous degeneration, left eye: Secondary | ICD-10-CM | POA: Diagnosis not present

## 2024-06-12 DIAGNOSIS — H0288B Meibomian gland dysfunction left eye, upper and lower eyelids: Secondary | ICD-10-CM | POA: Diagnosis not present

## 2024-06-12 DIAGNOSIS — Z0100 Encounter for examination of eyes and vision without abnormal findings: Secondary | ICD-10-CM | POA: Diagnosis not present

## 2024-07-09 ENCOUNTER — Other Ambulatory Visit: Payer: Self-pay | Admitting: Nurse Practitioner

## 2024-07-09 DIAGNOSIS — I10 Essential (primary) hypertension: Secondary | ICD-10-CM

## 2024-07-10 NOTE — Telephone Encounter (Signed)
 Requested Prescriptions  Pending Prescriptions Disp Refills   metoprolol  succinate (TOPROL -XL) 100 MG 24 hr tablet [Pharmacy Med Name: METOPROLOL  SUCC ER 100 MG TAB] 90 tablet 0    Sig: TAKE 1 TABLET BY MOUTH EVERY DAY     Cardiovascular:  Beta Blockers Passed - 07/10/2024 11:38 AM      Passed - Last BP in normal range    BP Readings from Last 1 Encounters:  01/18/24 126/84         Passed - Last Heart Rate in normal range    Pulse Readings from Last 1 Encounters:  01/18/24 77         Passed - Valid encounter within last 6 months    Recent Outpatient Visits           5 months ago Skin lesion   [facility] Leavy Mole, PA-C       Future Appointments             In 2 weeks Gareth, Mliss FALCON, FNP [facility], [facility]

## 2024-07-12 ENCOUNTER — Other Ambulatory Visit: Payer: Self-pay | Admitting: Nurse Practitioner

## 2024-07-12 DIAGNOSIS — E782 Mixed hyperlipidemia: Secondary | ICD-10-CM

## 2024-07-12 NOTE — Telephone Encounter (Signed)
 Rx 08/09/23 #90 3RF- too soon Requested Prescriptions  Pending Prescriptions Disp Refills   atorvastatin  (LIPITOR) 10 MG tablet [Pharmacy Med Name: ATORVASTATIN  10 MG TABLET] 90 tablet 3    Sig: TAKE 1 TABLET BY MOUTH EVERY DAY     Cardiovascular:  Antilipid - Statins Failed - 07/12/2024  3:50 PM      Failed - Lipid Panel in normal range within the last 12 months    Cholesterol, Total  Date Value Ref Range Status  05/19/2016 157 100 - 199 mg/dL Final   Cholesterol  Date Value Ref Range Status  01/19/2023 133 <200 mg/dL Final   LDL Cholesterol (Calc)  Date Value Ref Range Status  01/19/2023 67 mg/dL (calc) Final    Comment:    Reference range: <100 . Desirable range <100 mg/dL for primary prevention;   <70 mg/dL for patients with CHD or diabetic patients  with > or = 2 CHD risk factors. SABRA LDL-C is now calculated using the Martin-Hopkins  calculation, which is a validated novel method providing  better accuracy than the Friedewald equation in the  estimation of LDL-C.  Gladis APPLETHWAITE et al. SANDREA. 7986;689(80): 2061-2068  (http://education.[facility].com/faq/FAQ164)    HDL  Date Value Ref Range Status  01/19/2023 49 > OR = 40 mg/dL Final  93/92/7982 63 >60 mg/dL Final   Triglycerides  Date Value Ref Range Status  01/19/2023 83 <150 mg/dL Final         Passed - Patient is not pregnant      Passed - Valid encounter within last 12 months    Recent Outpatient Visits           5 months ago Skin lesion   [facility] Leavy Mole, PA-C       Future Appointments             In 1 week Gareth, Mliss FALCON, FNP [facility], [facility]

## 2024-07-25 ENCOUNTER — Ambulatory Visit (INDEPENDENT_AMBULATORY_CARE_PROVIDER_SITE_OTHER): Payer: Self-pay | Admitting: Nurse Practitioner

## 2024-07-25 ENCOUNTER — Encounter: Payer: Self-pay | Admitting: Nurse Practitioner

## 2024-07-25 VITALS — BP 136/82 | HR 70 | Temp 97.9°F | Resp 18 | Ht 71.0 in | Wt 197.2 lb

## 2024-07-25 DIAGNOSIS — Z125 Encounter for screening for malignant neoplasm of prostate: Secondary | ICD-10-CM

## 2024-07-25 DIAGNOSIS — J301 Allergic rhinitis due to pollen: Secondary | ICD-10-CM

## 2024-07-25 DIAGNOSIS — R7303 Prediabetes: Secondary | ICD-10-CM

## 2024-07-25 DIAGNOSIS — E782 Mixed hyperlipidemia: Secondary | ICD-10-CM | POA: Diagnosis not present

## 2024-07-25 DIAGNOSIS — L409 Psoriasis, unspecified: Secondary | ICD-10-CM | POA: Diagnosis not present

## 2024-07-25 DIAGNOSIS — I10 Essential (primary) hypertension: Secondary | ICD-10-CM | POA: Diagnosis not present

## 2024-07-25 DIAGNOSIS — N400 Enlarged prostate without lower urinary tract symptoms: Secondary | ICD-10-CM | POA: Diagnosis not present

## 2024-07-25 DIAGNOSIS — R413 Other amnesia: Secondary | ICD-10-CM

## 2024-07-25 MED ORDER — TAMSULOSIN HCL 0.4 MG PO CAPS: 0.4000 mg | ORAL_CAPSULE | Freq: Every day | ORAL | 3 refills | Status: AC

## 2024-07-25 MED ORDER — ATORVASTATIN CALCIUM 10 MG PO TABS: 10.0000 mg | ORAL_TABLET | Freq: Every day | ORAL | 3 refills | Status: AC

## 2024-07-25 NOTE — Progress Notes (Signed)
 BP 136/82   Pulse 70   Temp 97.9 F (36.6 C)   Resp 18   Ht 5' 11 (1.803 m)   Wt 197 lb 3.2 oz (89.4 kg)   SpO2 96%   BMI 27.50 kg/m    Subjective:    Patient ID: Jeffrey Yates, male    DOB: 12/12/1951, 73 y.o.   MRN: [id number]  HPI: Jeffrey Yates is a 73 y.o. male  Chief Complaint  Patient presents with   Medical Management of Chronic Issues   Medication Refill   Hypertension   Hyperlipidemia    Discussed the use of AI scribe software for clinical note transcription with the patient, who gave verbal consent to proceed.  History of Present Illness Jeffrey Yates is a 73 year old male who presents for a routine follow-up.  Hypertension and hyperlipidemia - Hypertension managed with metoprolol  100 mg daily - Hyperlipidemia managed with atorvastatin  10 mg daily - Last lipid panel reported as good  Cognitive impairment - Memory impairment managed with Namenda 5 mg twice daily and Aricept 10 mg at bedtime  Benign prostatic hyperplasia and lower urinary tract symptoms - Benign prostatic hyperplasia managed with dutasteride  0.5 mg daily and Flomax  0.4 mg daily  Allergic rhinitis - History of allergic rhinitis -stable  Psoriasis - History of psoriasis -stable  Gilbert's disease - History of Gilbert's disease     Body mass index is 27.5 kg/m.  Filed Weights   07/25/24 0847  Weight: 197 lb 3.2 oz (89.4 kg)   Waist Measurement : 32 inches      07/25/2024    8:49 AM 07/20/2023    8:37 AM 01/19/2023    8:27 AM  Depression screen PHQ 2/9  Decreased Interest 0 0 0  Down, Depressed, Hopeless 0 0 0  PHQ - 2 Score 0 0 0  Altered sleeping 0    Tired, decreased energy 0    Change in appetite 0    Feeling bad or failure about yourself  0    Trouble concentrating 0    Moving slowly or fidgety/restless 0    Suicidal thoughts 0    PHQ-9 Score 0    Difficult doing work/chores Not difficult at all      Relevant past medical, surgical, family and social history  reviewed and updated as indicated. Interim medical history since our last visit reviewed. Allergies and medications reviewed and updated.  Review of Systems  Constitutional: Negative for fever or weight change.  Respiratory: Negative for cough and shortness of breath.   Cardiovascular: Negative for chest pain or palpitations.  Gastrointestinal: Negative for abdominal pain, no bowel changes.  Musculoskeletal: Negative for gait problem or joint swelling.  Skin: Negative for rash.  Neurological: Negative for dizziness or headache.  No other specific complaints in a complete review of systems (except as listed in HPI above).      Objective:     BP 136/82   Pulse 70   Temp 97.9 F (36.6 C)   Resp 18   Ht 5' 11 (1.803 m)   Wt 197 lb 3.2 oz (89.4 kg)   SpO2 96%   BMI 27.50 kg/m    Wt Readings from Last 3 Encounters:  07/25/24 197 lb 3.2 oz (89.4 kg)  01/18/24 170 lb (77.1 kg)  07/20/23 181 lb (82.1 kg)    Physical Exam Physical Exam VITALS: BP- 136/82 MEASUREMENTS: Weight- 197, BMI- 27.5. GENERAL: Alert, cooperative, well developed, no acute distress HEENT: Normocephalic,  normal oropharynx, moist mucous membranes CHEST: Clear to auscultation bilaterally, no wheezes, rhonchi, or crackles CARDIOVASCULAR: Normal heart rate and rhythm, S1 and S2 normal without murmurs ABDOMEN: Soft, non-tender, non-distended, without organomegaly, normal bowel sounds EXTREMITIES: No cyanosis or edema NEUROLOGICAL: Cranial nerves grossly intact, moves all extremities without gross motor or sensory deficit   Results for orders placed or performed in visit on 01/19/23  CBC with Differential/Platelet   Collection Time: 01/19/23  8:51 AM  Result Value Ref Range   WBC 5.8 3.8 - 10.8 Thousand/uL   RBC 4.69 4.20 - 5.80 Million/uL   Hemoglobin 14.8 13.2 - 17.1 g/dL   HCT 56.2 61.4 - 49.9 %   MCV 93.2 80.0 - 100.0 fL   MCH 31.6 27.0 - 33.0 pg   MCHC 33.9 32.0 - 36.0 g/dL   RDW 88.0 88.9 - 84.9 %    Platelets 213 140 - 400 Thousand/uL   MPV 9.8 7.5 - 12.5 fL   Neutro Abs 4,147 1,500 - 7,800 cells/uL   Lymphs Abs 835 (L) 850 - 3,900 cells/uL   Absolute Monocytes 621 200 - 950 cells/uL   Eosinophils Absolute 168 15 - 500 cells/uL   Basophils Absolute 29 0 - 200 cells/uL   Neutrophils Relative % 71.5 %   Total Lymphocyte 14.4 %   Monocytes Relative 10.7 %   Eosinophils Relative 2.9 %   Basophils Relative 0.5 %  COMPLETE METABOLIC PANEL WITH GFR   Collection Time: 01/19/23  8:51 AM  Result Value Ref Range   Glucose, Bld 89 65 - 99 mg/dL   BUN 23 7 - 25 mg/dL   Creat 9.09 9.29 - 8.71 mg/dL   eGFR 91 > OR = 60 fO/fpw/8.26f7   BUN/Creatinine Ratio SEE NOTE: 6 - 22 (calc)   Sodium 143 135 - 146 mmol/L   Potassium 5.1 3.5 - 5.3 mmol/L   Chloride 104 98 - 110 mmol/L   CO2 25 20 - 32 mmol/L   Calcium 10.0 8.6 - 10.3 mg/dL   Total Protein 6.8 6.1 - 8.1 g/dL   Albumin 4.7 3.6 - 5.1 g/dL   Globulin 2.1 1.9 - 3.7 g/dL (calc)   AG Ratio 2.2 1.0 - 2.5 (calc)   Total Bilirubin 1.6 (H) 0.2 - 1.2 mg/dL   Alkaline phosphatase (APISO) 68 35 - 144 U/L   AST 21 10 - 35 U/L   ALT 18 9 - 46 U/L  Lipid panel   Collection Time: 01/19/23  8:51 AM  Result Value Ref Range   Cholesterol 133 <200 mg/dL   HDL 49 > OR = 40 mg/dL   Triglycerides 83 <849 mg/dL   LDL Cholesterol (Calc) 67 mg/dL (calc)   Total CHOL/HDL Ratio 2.7 <5.0 (calc)   Non-HDL Cholesterol (Calc) 84 <869 mg/dL (calc)  Hemoglobin J8r   Collection Time: 01/19/23  8:51 AM  Result Value Ref Range   Hgb A1c MFr Bld 5.7 (H) <5.7 % of total Hgb   Mean Plasma Glucose 117 mg/dL   eAG (mmol/L) 6.5 mmol/L          Assessment & Plan:   Problem List Items Addressed This Visit       Cardiovascular and Mediastinum   Essential hypertension - Primary   Relevant Medications   atorvastatin (LIPITOR) 10 MG tablet   Other Relevant Orders   CBC with Differential/Platelet   Comprehensive metabolic panel with GFR     Respiratory    Allergic rhinitis     Musculoskeletal and Integument  Psoriasis     Genitourinary   Benign non-nodular prostatic hyperplasia without lower urinary tract symptoms   Relevant Medications   tamsulosin (FLOMAX) 0.4 MG CAPS capsule   Other Relevant Orders   PSA     Other   Hyperlipidemia   Relevant Medications   atorvastatin (LIPITOR) 10 MG tablet   Other Relevant Orders   Lipid panel   Gilbert's disease   Relevant Orders   Comprehensive metabolic panel with GFR   Memory difficulties   Other Visit Diagnoses       Screening for prostate cancer       Relevant Orders   PSA     Prediabetes       Relevant Orders   Comprehensive metabolic panel with GFR   Hemoglobin A1c        Assessment and Plan Assessment & Plan Hypertension Blood pressure is 136/82 mmHg, borderline. - Continue metoprolol 100 mg daily.  Benign prostatic hyperplasia with lower urinary tract symptoms No new symptoms reported. - Continue dutasteride 0.5 mg daily and Flomax 0.4 mg daily.  Hyperlipidemia Lipid panel results are within target range. - Continue atorvastatin 10 mg daily.  Memory impairment No changes noted from recent neurology visit. - Continue Risperdal 0.25 mg twice daily, Namenda 5 mg twice daily, and Aricept 10 mg at bedtime.  Prediabetes Last A1c was 5.7%, indicating prediabetes. - Continue lifestyle modifications.        Follow up plan: Return in about 6 months (around 01/25/2025) for follow up.

## 2024-07-26 ENCOUNTER — Ambulatory Visit: Payer: Self-pay | Admitting: Nurse Practitioner

## 2024-07-26 LAB — COMPREHENSIVE METABOLIC PANEL WITH GFR
AG Ratio: 2.3 (calc) (ref 1.0–2.5)
ALT: 31 U/L (ref 9–46)
AST: 29 U/L (ref 10–35)
Albumin: 4.6 g/dL (ref 3.6–5.1)
Alkaline phosphatase (APISO): 70 U/L (ref 35–144)
BUN: 17 mg/dL (ref 7–25)
CO2: 27 mmol/L (ref 20–32)
Calcium: 9.3 mg/dL (ref 8.6–10.3)
Chloride: 104 mmol/L (ref 98–110)
Creat: 0.93 mg/dL (ref 0.70–1.28)
Globulin: 2 g/dL (ref 1.9–3.7)
Glucose, Bld: 93 mg/dL (ref 65–99)
Potassium: 4.6 mmol/L (ref 3.5–5.3)
Sodium: 139 mmol/L (ref 135–146)
Total Bilirubin: 1.6 mg/dL — ABNORMAL HIGH (ref 0.2–1.2)
Total Protein: 6.6 g/dL (ref 6.1–8.1)
eGFR: 87 mL/min/1.73m2 (ref >=60)

## 2024-07-26 LAB — CBC WITH DIFFERENTIAL/PLATELET
Absolute Lymphocytes: 846 {cells}/uL — ABNORMAL LOW (ref 850–3900)
Absolute Monocytes: 739 {cells}/uL (ref 200–950)
Basophils Absolute: 78 {cells}/uL (ref 0–200)
Basophils Relative: 1.4 %
Eosinophils Absolute: 207 {cells}/uL (ref 15–500)
Eosinophils Relative: 3.7 %
HCT: 45.7 % (ref 38.5–50.0)
Hemoglobin: 15.1 g/dL (ref 13.2–17.1)
MCH: 31.9 pg (ref 27.0–33.0)
MCHC: 33 g/dL (ref 32.0–36.0)
MCV: 96.6 fL (ref 80.0–100.0)
MPV: 9.7 fL (ref 7.5–12.5)
Monocytes Relative: 13.2 %
Neutro Abs: 3730 {cells}/uL (ref 1500–7800)
Neutrophils Relative %: 66.6 %
Platelets: 192 Thousand/uL (ref 140–400)
RBC: 4.73 Million/uL (ref 4.20–5.80)
RDW: 12.8 % (ref 11.0–15.0)
Total Lymphocyte: 15.1 %
WBC: 5.6 Thousand/uL (ref 3.8–10.8)

## 2024-07-26 LAB — HEMOGLOBIN A1C
Hgb A1c MFr Bld: 5.7 % — ABNORMAL HIGH (ref <5.7)
Mean Plasma Glucose: 117 mg/dL
eAG (mmol/L): 6.5 mmol/L

## 2024-07-26 LAB — LIPID PANEL
Cholesterol: 135 mg/dL (ref <200)
HDL: 39 mg/dL — ABNORMAL LOW (ref >=40)
LDL Cholesterol (Calc): 76 mg/dL
Non-HDL Cholesterol (Calc): 96 mg/dL (ref <130)
Total CHOL/HDL Ratio: 3.5 (calc) (ref <5.0)
Triglycerides: 114 mg/dL (ref <150)

## 2024-07-26 LAB — PSA: PSA: 0.24 ng/mL (ref <=4.00)

## 2024-08-25 ENCOUNTER — Other Ambulatory Visit: Payer: Self-pay | Admitting: Nurse Practitioner

## 2024-08-27 NOTE — Telephone Encounter (Signed)
 Requested Prescriptions  Pending Prescriptions Disp Refills   dutasteride  (AVODART ) 0.5 MG capsule [Pharmacy Med Name: DUTASTERIDE  0.5 MG CAPSULE] 90 capsule 1    Sig: TAKE 1 CAPSULE BY MOUTH EVERY DAY     Urology: 5-alpha Reductase Inhibitors Passed - 08/27/2024  3:41 PM      Passed - PSA in normal range and within 360 days    PSA  Date Value Ref Range Status  07/25/2024 0.24 < OR = 4.00 ng/mL Final    Comment:    The total PSA value from this assay system is  standardized against the WHO standard. The test  result will be approximately 20% lower when compared  to the equimolar-standardized total PSA (Beckman  Coulter). Comparison of serial PSA results should be  interpreted with this fact in mind. . This test was performed using the Siemens  chemiluminescent method. Values obtained from  different assay methods cannot be used interchangeably. PSA levels, regardless of value, should not be interpreted as absolute evidence of the presence or absence of disease.          Passed - Valid encounter within last 12 months    Recent Outpatient Visits           1 month ago Essential hypertension   [facility] Gareth Mliss FALCON, FNP   7 months ago Skin lesion   [facility] Leavy Mole, PA-C       Future Appointments             In 5 months Gareth, Mliss FALCON, FNP [facility] [place]

## 2024-10-05 ENCOUNTER — Other Ambulatory Visit: Payer: Self-pay | Admitting: Nurse Practitioner

## 2024-10-05 DIAGNOSIS — I10 Essential (primary) hypertension: Secondary | ICD-10-CM

## 2024-10-06 NOTE — Telephone Encounter (Signed)
 Requested Prescriptions  Pending Prescriptions Disp Refills   metoprolol  succinate (TOPROL -XL) 100 MG 24 hr tablet [Pharmacy Med Name: METOPROLOL  SUCC ER 100 MG TAB] 90 tablet 0    Sig: TAKE 1 TABLET BY MOUTH EVERY DAY     Cardiovascular:  Beta Blockers Passed - 10/06/2024  9:01 AM      Passed - Last BP in normal range    BP Readings from Last 1 Encounters:  07/25/24 136/82         Passed - Last Heart Rate in normal range    Pulse Readings from Last 1 Encounters:  07/25/24 70         Passed - Valid encounter within last 6 months    Recent Outpatient Visits           2 months ago Essential hypertension   [facility] Gareth Mliss FALCON, FNP   8 months ago Skin lesion   [facility] Leavy Mole, PA-C

## 2025-01-03 ENCOUNTER — Other Ambulatory Visit: Payer: Self-pay | Admitting: Nurse Practitioner

## 2025-01-03 DIAGNOSIS — I10 Essential (primary) hypertension: Secondary | ICD-10-CM

## 2025-01-03 NOTE — Telephone Encounter (Signed)
 Requested Prescriptions  Pending Prescriptions Disp Refills   metoprolol  succinate (TOPROL -XL) 100 MG 24 hr tablet [Pharmacy Med Name: METOPROLOL  SUCC ER 100 MG TAB] 90 tablet 0    Sig: TAKE 1 TABLET BY MOUTH EVERY DAY     Cardiovascular:  Beta Blockers Passed - 01/03/2025  4:26 PM      Passed - Last BP in normal range    BP Readings from Last 1 Encounters:  07/25/24 136/82         Passed - Last Heart Rate in normal range    Pulse Readings from Last 1 Encounters:  07/25/24 70         Passed - Valid encounter within last 6 months    Recent Outpatient Visits           5 months ago Essential hypertension   [facility] Gareth Mliss FALCON, FNP   11 months ago Skin lesion   [facility] Leavy Mole, PA-C

## 2025-01-30 ENCOUNTER — Ambulatory Visit: Admitting: Nurse Practitioner

## 2025-07-31 ENCOUNTER — Ambulatory Visit: Admitting: Nurse Practitioner
